# Patient Record
Sex: Male | Born: 1970
Health system: Southern US, Community
[De-identification: ages and names within clinical notes are randomized; demographics above are authoritative.]

## PROBLEM LIST (undated history)

## (undated) DIAGNOSIS — M533 Sacrococcygeal disorders, not elsewhere classified: Secondary | ICD-10-CM

## (undated) DIAGNOSIS — F329 Major depressive disorder, single episode, unspecified: Secondary | ICD-10-CM

## (undated) DIAGNOSIS — F319 Bipolar disorder, unspecified: Secondary | ICD-10-CM

## (undated) DIAGNOSIS — F32A Depression, unspecified: Secondary | ICD-10-CM

## (undated) DIAGNOSIS — R51 Headache: Secondary | ICD-10-CM

## (undated) HISTORY — PX: SKIN CANCER EXCISION: SHX779

## (undated) HISTORY — DX: Bipolar disorder, unspecified: F31.9

## (undated) HISTORY — PX: ANKLE FRACTURE SURGERY: SHX122

## (undated) HISTORY — PX: OTHER SURGICAL HISTORY: SHX169

## (undated) HISTORY — PX: BONY PELVIS SURGERY: SHX572

---

## 2006-08-13 ENCOUNTER — Emergency Department (HOSPITAL_COMMUNITY): Admission: EM | Admit: 2006-08-13 | Discharge: 2006-08-13 | Payer: Self-pay | Admitting: Family Medicine

## 2010-02-04 ENCOUNTER — Emergency Department (HOSPITAL_COMMUNITY): Admission: EM | Admit: 2010-02-04 | Discharge: 2010-02-04 | Payer: Self-pay | Admitting: Emergency Medicine

## 2010-11-26 ENCOUNTER — Encounter: Payer: Self-pay | Admitting: Neurology

## 2012-05-23 ENCOUNTER — Encounter (HOSPITAL_COMMUNITY): Payer: Self-pay | Admitting: *Deleted

## 2012-05-23 ENCOUNTER — Ambulatory Visit (HOSPITAL_COMMUNITY)
Admission: RE | Admit: 2012-05-23 | Discharge: 2012-05-23 | Disposition: A | Discharge status: Home / Self Care | Payer: BC Managed Care – PPO | Attending: Psychiatry | Admitting: Psychiatry

## 2012-05-23 DIAGNOSIS — F319 Bipolar disorder, unspecified: Secondary | ICD-10-CM | POA: Insufficient documentation

## 2012-05-23 HISTORY — DX: Headache: R51
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
## 2013-06-20 ENCOUNTER — Encounter: Payer: Self-pay | Admitting: Emergency Medicine

## 2013-06-20 ENCOUNTER — Emergency Department
Admission: EM | Admit: 2013-06-20 | Discharge: 2013-06-20 | Disposition: A | Discharge status: Home / Self Care | Payer: BC Managed Care – PPO | Source: Home / Self Care | Attending: Family Medicine | Admitting: Family Medicine

## 2013-06-20 DIAGNOSIS — B029 Zoster without complications: Secondary | ICD-10-CM

## 2013-06-20 HISTORY — DX: Sacrococcygeal disorders, not elsewhere classified: M53.3

## 2013-06-20 MED ORDER — VALACYCLOVIR HCL 1 G PO TABS: 1000.0000 mg | ORAL_TABLET | Freq: Three times a day (TID) | ORAL | Status: DC

## 2013-06-20 NOTE — ED Notes (Signed)
Reports onset of rash along rib cage right side, accompanied with pain x 2 days; no known exposure to allergens.

## 2013-06-20 NOTE — ED Provider Notes (Signed)
CSN: [id number]     Arrival date & time 06/20/13  1258 History     First MD Initiated Contact with Patient 06/20/13 1319     Chief Complaint  Patient presents with  . Rash      HPI Comments: Patient noticed a painful vesicular rash on his right lateral chest two days ago, followed by a similar lesion over right scapula.  No fevers, chills, and sweats.  He feels well otherwise.  Patient is a 42 y.o. male presenting with rash. The history is provided by the patient.  Rash Pain location: right chest and right upper back. Pain quality: sharp   Pain radiates to:  Does not radiate Pain severity:  Moderate Onset quality:  Gradual Duration:  2 days Timing:  Constant Progression:  Unchanged Chronicity:  New Relieved by:  Nothing Worsened by:  Nothing tried Ineffective treatments:  None tried Associated symptoms: no anorexia, no chest pain, no chills, no fatigue, no fever and no sore throat     Past Medical History  Diagnosis Date  . Headache    No past surgical history on file. No family history on file. History  Substance Use Topics  . Smoking status: Never Smoker   . Smokeless tobacco: Never Used  . Alcohol Use: No    Review of Systems  Constitutional: Negative for fever, chills and fatigue.  HENT: Negative for sore throat.   Cardiovascular: Negative for chest pain.  Gastrointestinal: Negative for anorexia.  Skin: Positive for rash.    Allergies  Review of patient's allergies indicates no known allergies.  Home Medications   Current Outpatient Rx  Name  Route  Sig  Dispense  Refill  . divalproex (DEPAKOTE) 500 MG DR tablet   Oral   Take 1,000 mg by mouth 2 (two) times daily.         Marland Kitchen lamoTRIgine (LAMICTAL) 150 MG tablet   Oral   Take 150 mg by mouth at bedtime.         . propranolol (INDERAL) 40 MG tablet   Oral   Take 40 mg by mouth 2 (two) times daily.         . SUMAtriptan (IMITREX) 6 MG/0.5ML SOLN injection   Subcutaneous   Inject 6 mg into  the skin every 2 (two) hours as needed. F         . valACYclovir (VALTREX) 1000 MG tablet   Oral   Take 1 tablet (1,000 mg total) by mouth 3 (three) times daily.   21 tablet   0   . zolpidem (AMBIEN) 10 MG tablet   Oral   Take 10 mg by mouth at bedtime as needed.          There were no vitals taken for this visit. Physical Exam  Nursing note and vitals reviewed. Constitutional: He is oriented to person, place, and time. He appears well-developed and well-nourished.  HENT:  Head: Normocephalic.  Mouth/Throat: Oropharynx is clear and moist.  Eyes: Conjunctivae are normal. Pupils are equal, round, and reactive to light.  Neck: Neck supple.  Pulmonary/Chest: Breath sounds normal.  Abdominal: Bowel sounds are normal.  Neurological: He is alert and oriented to person, place, and time.  Skin: Skin is warm and dry. Rash noted. Rash is vesicular.     In the right mid-axillary line is a 1.5cm by 3cm erythematous vesicular eruption.  A smaller lesion is present above the right scapula.    ED Course   Procedures  none  1. Herpes zoster     MDM  Begin Valtrex May use capsaicin topical ointment or cream (such as Zostrix) for pain. Followup with Family Doctor if not improved in one week.   Lattie Haw, MD 06/20/13 1344

## 2016-04-12 DIAGNOSIS — E039 Hypothyroidism, unspecified: Secondary | ICD-10-CM | POA: Insufficient documentation

## 2016-04-12 DIAGNOSIS — E291 Testicular hypofunction: Secondary | ICD-10-CM | POA: Insufficient documentation

## 2016-08-07 DIAGNOSIS — F3131 Bipolar disorder, current episode depressed, mild: Secondary | ICD-10-CM | POA: Diagnosis not present

## 2016-08-07 DIAGNOSIS — F3160 Bipolar disorder, current episode mixed, unspecified: Secondary | ICD-10-CM | POA: Diagnosis not present

## 2016-08-07 DIAGNOSIS — F3111 Bipolar disorder, current episode manic without psychotic features, mild: Secondary | ICD-10-CM | POA: Diagnosis not present

## 2016-08-09 DIAGNOSIS — F3160 Bipolar disorder, current episode mixed, unspecified: Secondary | ICD-10-CM | POA: Diagnosis not present

## 2016-08-14 DIAGNOSIS — F3160 Bipolar disorder, current episode mixed, unspecified: Secondary | ICD-10-CM | POA: Diagnosis not present

## 2016-08-16 DIAGNOSIS — F3160 Bipolar disorder, current episode mixed, unspecified: Secondary | ICD-10-CM | POA: Diagnosis not present

## 2016-08-21 DIAGNOSIS — F3160 Bipolar disorder, current episode mixed, unspecified: Secondary | ICD-10-CM | POA: Diagnosis not present

## 2016-08-23 DIAGNOSIS — F3160 Bipolar disorder, current episode mixed, unspecified: Secondary | ICD-10-CM | POA: Diagnosis not present

## 2016-08-28 DIAGNOSIS — F3160 Bipolar disorder, current episode mixed, unspecified: Secondary | ICD-10-CM | POA: Diagnosis not present

## 2016-08-30 DIAGNOSIS — F3160 Bipolar disorder, current episode mixed, unspecified: Secondary | ICD-10-CM | POA: Diagnosis not present

## 2016-09-03 DIAGNOSIS — F3131 Bipolar disorder, current episode depressed, mild: Secondary | ICD-10-CM | POA: Diagnosis not present

## 2016-09-03 DIAGNOSIS — F3111 Bipolar disorder, current episode manic without psychotic features, mild: Secondary | ICD-10-CM | POA: Diagnosis not present

## 2016-09-04 DIAGNOSIS — F3160 Bipolar disorder, current episode mixed, unspecified: Secondary | ICD-10-CM | POA: Diagnosis not present

## 2016-09-07 DIAGNOSIS — F3160 Bipolar disorder, current episode mixed, unspecified: Secondary | ICD-10-CM | POA: Diagnosis not present

## 2016-09-11 DIAGNOSIS — F3160 Bipolar disorder, current episode mixed, unspecified: Secondary | ICD-10-CM | POA: Diagnosis not present

## 2016-09-13 DIAGNOSIS — F3160 Bipolar disorder, current episode mixed, unspecified: Secondary | ICD-10-CM | POA: Diagnosis not present

## 2016-09-18 DIAGNOSIS — F3160 Bipolar disorder, current episode mixed, unspecified: Secondary | ICD-10-CM | POA: Diagnosis not present

## 2016-09-20 DIAGNOSIS — F3160 Bipolar disorder, current episode mixed, unspecified: Secondary | ICD-10-CM | POA: Diagnosis not present

## 2016-09-25 DIAGNOSIS — F3160 Bipolar disorder, current episode mixed, unspecified: Secondary | ICD-10-CM | POA: Diagnosis not present

## 2016-10-02 DIAGNOSIS — F3160 Bipolar disorder, current episode mixed, unspecified: Secondary | ICD-10-CM | POA: Diagnosis not present

## 2016-10-08 DIAGNOSIS — G43009 Migraine without aura, not intractable, without status migrainosus: Secondary | ICD-10-CM | POA: Diagnosis not present

## 2016-10-08 DIAGNOSIS — F3160 Bipolar disorder, current episode mixed, unspecified: Secondary | ICD-10-CM | POA: Diagnosis not present

## 2016-10-11 DIAGNOSIS — F3160 Bipolar disorder, current episode mixed, unspecified: Secondary | ICD-10-CM | POA: Diagnosis not present

## 2016-10-12 DIAGNOSIS — F3160 Bipolar disorder, current episode mixed, unspecified: Secondary | ICD-10-CM | POA: Diagnosis not present

## 2016-10-12 DIAGNOSIS — E039 Hypothyroidism, unspecified: Secondary | ICD-10-CM | POA: Diagnosis not present

## 2016-10-12 DIAGNOSIS — E291 Testicular hypofunction: Secondary | ICD-10-CM | POA: Diagnosis not present

## 2016-10-16 DIAGNOSIS — E039 Hypothyroidism, unspecified: Secondary | ICD-10-CM | POA: Diagnosis not present

## 2016-10-16 DIAGNOSIS — F900 Attention-deficit hyperactivity disorder, predominantly inattentive type: Secondary | ICD-10-CM | POA: Diagnosis not present

## 2016-10-16 DIAGNOSIS — F341 Dysthymic disorder: Secondary | ICD-10-CM | POA: Diagnosis not present

## 2016-10-16 DIAGNOSIS — E291 Testicular hypofunction: Secondary | ICD-10-CM | POA: Diagnosis not present

## 2016-10-22 DIAGNOSIS — F3160 Bipolar disorder, current episode mixed, unspecified: Secondary | ICD-10-CM | POA: Diagnosis not present

## 2016-11-01 DIAGNOSIS — F3160 Bipolar disorder, current episode mixed, unspecified: Secondary | ICD-10-CM | POA: Diagnosis not present

## 2016-11-06 DIAGNOSIS — F3160 Bipolar disorder, current episode mixed, unspecified: Secondary | ICD-10-CM | POA: Diagnosis not present

## 2016-11-08 DIAGNOSIS — F3160 Bipolar disorder, current episode mixed, unspecified: Secondary | ICD-10-CM | POA: Diagnosis not present

## 2016-11-12 DIAGNOSIS — F3160 Bipolar disorder, current episode mixed, unspecified: Secondary | ICD-10-CM | POA: Diagnosis not present

## 2016-11-15 DIAGNOSIS — F3160 Bipolar disorder, current episode mixed, unspecified: Secondary | ICD-10-CM | POA: Diagnosis not present

## 2016-11-22 DIAGNOSIS — F3160 Bipolar disorder, current episode mixed, unspecified: Secondary | ICD-10-CM | POA: Diagnosis not present

## 2016-11-27 DIAGNOSIS — F3160 Bipolar disorder, current episode mixed, unspecified: Secondary | ICD-10-CM | POA: Diagnosis not present

## 2016-11-29 DIAGNOSIS — F3160 Bipolar disorder, current episode mixed, unspecified: Secondary | ICD-10-CM | POA: Diagnosis not present

## 2016-12-03 DIAGNOSIS — F3160 Bipolar disorder, current episode mixed, unspecified: Secondary | ICD-10-CM | POA: Diagnosis not present

## 2016-12-06 DIAGNOSIS — F3160 Bipolar disorder, current episode mixed, unspecified: Secondary | ICD-10-CM | POA: Diagnosis not present

## 2016-12-10 DIAGNOSIS — F3160 Bipolar disorder, current episode mixed, unspecified: Secondary | ICD-10-CM | POA: Diagnosis not present

## 2016-12-13 DIAGNOSIS — F3160 Bipolar disorder, current episode mixed, unspecified: Secondary | ICD-10-CM | POA: Diagnosis not present

## 2016-12-17 DIAGNOSIS — F3160 Bipolar disorder, current episode mixed, unspecified: Secondary | ICD-10-CM | POA: Diagnosis not present

## 2016-12-20 DIAGNOSIS — F3160 Bipolar disorder, current episode mixed, unspecified: Secondary | ICD-10-CM | POA: Diagnosis not present

## 2016-12-24 DIAGNOSIS — F3160 Bipolar disorder, current episode mixed, unspecified: Secondary | ICD-10-CM | POA: Diagnosis not present

## 2016-12-27 DIAGNOSIS — F3160 Bipolar disorder, current episode mixed, unspecified: Secondary | ICD-10-CM | POA: Diagnosis not present

## 2017-01-01 DIAGNOSIS — F3160 Bipolar disorder, current episode mixed, unspecified: Secondary | ICD-10-CM | POA: Diagnosis not present

## 2017-01-03 DIAGNOSIS — F3160 Bipolar disorder, current episode mixed, unspecified: Secondary | ICD-10-CM | POA: Diagnosis not present

## 2017-01-07 DIAGNOSIS — D225 Melanocytic nevi of trunk: Secondary | ICD-10-CM | POA: Diagnosis not present

## 2017-01-07 DIAGNOSIS — D485 Neoplasm of uncertain behavior of skin: Secondary | ICD-10-CM | POA: Diagnosis not present

## 2017-01-07 DIAGNOSIS — Z809 Family history of malignant neoplasm, unspecified: Secondary | ICD-10-CM | POA: Diagnosis not present

## 2017-01-07 DIAGNOSIS — Z1283 Encounter for screening for malignant neoplasm of skin: Secondary | ICD-10-CM | POA: Diagnosis not present

## 2017-01-07 DIAGNOSIS — L821 Other seborrheic keratosis: Secondary | ICD-10-CM | POA: Diagnosis not present

## 2017-01-07 DIAGNOSIS — F3160 Bipolar disorder, current episode mixed, unspecified: Secondary | ICD-10-CM | POA: Diagnosis not present

## 2017-01-10 DIAGNOSIS — F3160 Bipolar disorder, current episode mixed, unspecified: Secondary | ICD-10-CM | POA: Diagnosis not present

## 2017-01-11 DIAGNOSIS — F900 Attention-deficit hyperactivity disorder, predominantly inattentive type: Secondary | ICD-10-CM | POA: Diagnosis not present

## 2017-01-11 DIAGNOSIS — F3132 Bipolar disorder, current episode depressed, moderate: Secondary | ICD-10-CM | POA: Diagnosis not present

## 2017-01-16 DIAGNOSIS — G43009 Migraine without aura, not intractable, without status migrainosus: Secondary | ICD-10-CM | POA: Diagnosis not present

## 2017-01-17 DIAGNOSIS — F3160 Bipolar disorder, current episode mixed, unspecified: Secondary | ICD-10-CM | POA: Diagnosis not present

## 2017-01-24 DIAGNOSIS — F3160 Bipolar disorder, current episode mixed, unspecified: Secondary | ICD-10-CM | POA: Diagnosis not present

## 2017-01-28 DIAGNOSIS — F3160 Bipolar disorder, current episode mixed, unspecified: Secondary | ICD-10-CM | POA: Diagnosis not present

## 2017-01-31 DIAGNOSIS — F3160 Bipolar disorder, current episode mixed, unspecified: Secondary | ICD-10-CM | POA: Diagnosis not present

## 2017-02-07 ENCOUNTER — Encounter: Payer: Self-pay | Admitting: Psychiatry

## 2017-02-07 ENCOUNTER — Ambulatory Visit (INDEPENDENT_AMBULATORY_CARE_PROVIDER_SITE_OTHER): Payer: BLUE CROSS/BLUE SHIELD | Admitting: Psychiatry

## 2017-02-07 VITALS — BP 112/77 | HR 84 | Temp 97.5°F | Wt 176.2 lb

## 2017-02-07 DIAGNOSIS — F332 Major depressive disorder, recurrent severe without psychotic features: Secondary | ICD-10-CM | POA: Diagnosis not present

## 2017-02-07 NOTE — Progress Notes (Signed)
ECT: This is an ECT consult for this 46 year old man referred by his outpatient psychiatrist, Dr.Kaur. Patient presents along with his wife. Both of them contributed to the history but there was agreement between them.  Current complaints of the patient are persistently depressed mood which is present almost all of the time. He still has some things in his life that are positive and he has been able to function adequately at work but feels sad and anxious much of the time. He sleeps adequately although he works night shift. His appetite is been a little poor. He has had some intrusive suicidal thoughts but states he has not acted on them and has no intention or plan to act on them. Patient feels hopeless and negative much of the time. He is currently being treated by his outpatient psychiatrist and is on Depakote Latuda and Adderall. He has no psychotic symptoms. No homicidal ideation.  Patient states his depression has been present since he was a young man. Things escalated and got much worse when his mother and sister died many years ago. Since then he has had brief periods of recovery but for the last year has been much worse. Suicidal ideation has gotten more intrusive. He has had 1 previous psychiatric hospitalization but no history of suicide attempts. Medications that have been tried that he lists include the current Depakote Latuda and Adderall as well as lithium and Seroquel. His outpatient psychiatrist sent me a note saying that he had been on antidepressants as well although he could not remember them. Apparently a diagnosis of bipolar disorder had been deposited based on his fact of having some lability and anger problems although I did not elicit a history of frank mania.  Patient is married. Has 2 teenage children at home. Patient's relationship with his wife is somewhat strained because he cheated on his wife in the last couple years and although he says that he no longer is doing that it is  still a major issue for him and her. He works as a Production manager. Works night shift. Says that he just barely gets by doing an adequate job at his work.  Patient has migraine headaches. That is the rationale for the Depakote. Also takes Imitrex. He tells me that he had a workup for his migraines in the past including an MRI and when he describes that he says that he was told that there was some kind of a mass in the lower part of his brain. He doesn't know much more about it. No other active medical issues.  Patient says he drinks occasionally and on weekends denies regular drinking denies it ever being a pattern or a problem. Denies any other drug abuse.  Patient is a neatly dressed man who looks his stated age. Cooperative with the interview. Eye contact is diminished. He looks down and looks a embarrassed much of the time. He is a little bit fidgety and withdrawn. Affect is blunted blank and negative. Thoughts appear lucid with no sign of bizarre or delusional thinking. Denies hallucinations. He is alert and oriented 4. A little bit psychomotor slowed and his speech is a little slow. Suicidal thoughts without intent or plan no homicidal thoughts. Able to understand medical decision making and make appropriate plans and decisions.  Assessment: This is a 46 year old man with severe recurrent major depression versus possible bipolar or bipolar 2. Depression is severe with intrusive suicidal thoughts and impairment in his life functioning. He has been on medication for years  without significant improvement. Based on this he would be a reasonable candidate for ECT.  I first concern is about his report of the MRI. I'm very concerned about the possibility of some kind offeeling lesion or abnormality on his brain MRI. For this reason I am reordering a head CT scan with his labs and we'll also get a release of information to get the old MRI scan. I will also concerned about his Depakote but because  it is helping with his headaches would not necessarily ask him to discontinue it until we see whether he can have affective seizures.  Patient given ample opportunity to ask questions. He states a tentative agreement with plan for ECT. Procedure and plan were described in detail. Patient was given the order form to get the usual labs done as well as getting a head CT. We will try and check up on the MRI as well. Patient has our number and we will be in touch. I will pass on information to the ECT team.

## 2017-02-11 DIAGNOSIS — F3160 Bipolar disorder, current episode mixed, unspecified: Secondary | ICD-10-CM | POA: Diagnosis not present

## 2017-02-14 DIAGNOSIS — F3160 Bipolar disorder, current episode mixed, unspecified: Secondary | ICD-10-CM | POA: Diagnosis not present

## 2017-02-18 DIAGNOSIS — F3160 Bipolar disorder, current episode mixed, unspecified: Secondary | ICD-10-CM | POA: Diagnosis not present

## 2017-02-25 DIAGNOSIS — S76301A Unspecified injury of muscle, fascia and tendon of the posterior muscle group at thigh level, right thigh, initial encounter: Secondary | ICD-10-CM | POA: Diagnosis not present

## 2017-02-26 DIAGNOSIS — F3160 Bipolar disorder, current episode mixed, unspecified: Secondary | ICD-10-CM | POA: Diagnosis not present

## 2017-02-28 DIAGNOSIS — F3160 Bipolar disorder, current episode mixed, unspecified: Secondary | ICD-10-CM | POA: Diagnosis not present

## 2017-03-04 DIAGNOSIS — F3160 Bipolar disorder, current episode mixed, unspecified: Secondary | ICD-10-CM | POA: Diagnosis not present

## 2017-03-07 DIAGNOSIS — F3160 Bipolar disorder, current episode mixed, unspecified: Secondary | ICD-10-CM | POA: Diagnosis not present

## 2017-03-11 DIAGNOSIS — F3160 Bipolar disorder, current episode mixed, unspecified: Secondary | ICD-10-CM | POA: Diagnosis not present

## 2017-03-14 DIAGNOSIS — F3160 Bipolar disorder, current episode mixed, unspecified: Secondary | ICD-10-CM | POA: Diagnosis not present

## 2017-03-15 DIAGNOSIS — F332 Major depressive disorder, recurrent severe without psychotic features: Secondary | ICD-10-CM | POA: Diagnosis not present

## 2017-03-15 DIAGNOSIS — F900 Attention-deficit hyperactivity disorder, predominantly inattentive type: Secondary | ICD-10-CM | POA: Diagnosis not present

## 2017-03-18 DIAGNOSIS — F3160 Bipolar disorder, current episode mixed, unspecified: Secondary | ICD-10-CM | POA: Diagnosis not present

## 2017-03-19 DIAGNOSIS — F3160 Bipolar disorder, current episode mixed, unspecified: Secondary | ICD-10-CM | POA: Diagnosis not present

## 2017-03-21 DIAGNOSIS — F3160 Bipolar disorder, current episode mixed, unspecified: Secondary | ICD-10-CM | POA: Diagnosis not present

## 2017-03-25 DIAGNOSIS — F3160 Bipolar disorder, current episode mixed, unspecified: Secondary | ICD-10-CM | POA: Diagnosis not present

## 2017-03-26 DIAGNOSIS — F3160 Bipolar disorder, current episode mixed, unspecified: Secondary | ICD-10-CM | POA: Diagnosis not present

## 2017-03-28 DIAGNOSIS — F3160 Bipolar disorder, current episode mixed, unspecified: Secondary | ICD-10-CM | POA: Diagnosis not present

## 2017-04-04 DIAGNOSIS — F3160 Bipolar disorder, current episode mixed, unspecified: Secondary | ICD-10-CM | POA: Diagnosis not present

## 2017-04-08 DIAGNOSIS — F3160 Bipolar disorder, current episode mixed, unspecified: Secondary | ICD-10-CM | POA: Diagnosis not present

## 2017-04-09 DIAGNOSIS — F3160 Bipolar disorder, current episode mixed, unspecified: Secondary | ICD-10-CM | POA: Diagnosis not present

## 2017-04-11 DIAGNOSIS — F3160 Bipolar disorder, current episode mixed, unspecified: Secondary | ICD-10-CM | POA: Diagnosis not present

## 2017-04-15 DIAGNOSIS — E291 Testicular hypofunction: Secondary | ICD-10-CM | POA: Diagnosis not present

## 2017-04-15 DIAGNOSIS — G43019 Migraine without aura, intractable, without status migrainosus: Secondary | ICD-10-CM | POA: Diagnosis not present

## 2017-04-15 DIAGNOSIS — E039 Hypothyroidism, unspecified: Secondary | ICD-10-CM | POA: Diagnosis not present

## 2017-04-15 DIAGNOSIS — Z79899 Other long term (current) drug therapy: Secondary | ICD-10-CM | POA: Diagnosis not present

## 2017-04-15 DIAGNOSIS — N528 Other male erectile dysfunction: Secondary | ICD-10-CM | POA: Diagnosis not present

## 2017-04-15 DIAGNOSIS — F3160 Bipolar disorder, current episode mixed, unspecified: Secondary | ICD-10-CM | POA: Diagnosis not present

## 2017-04-17 DIAGNOSIS — E291 Testicular hypofunction: Secondary | ICD-10-CM | POA: Diagnosis not present

## 2017-04-17 DIAGNOSIS — E039 Hypothyroidism, unspecified: Secondary | ICD-10-CM | POA: Diagnosis not present

## 2017-04-18 DIAGNOSIS — G43009 Migraine without aura, not intractable, without status migrainosus: Secondary | ICD-10-CM | POA: Diagnosis not present

## 2017-04-18 DIAGNOSIS — F3160 Bipolar disorder, current episode mixed, unspecified: Secondary | ICD-10-CM | POA: Diagnosis not present

## 2017-04-22 DIAGNOSIS — F3160 Bipolar disorder, current episode mixed, unspecified: Secondary | ICD-10-CM | POA: Diagnosis not present

## 2017-04-23 DIAGNOSIS — F900 Attention-deficit hyperactivity disorder, predominantly inattentive type: Secondary | ICD-10-CM | POA: Diagnosis not present

## 2017-04-23 DIAGNOSIS — F331 Major depressive disorder, recurrent, moderate: Secondary | ICD-10-CM | POA: Diagnosis not present

## 2017-04-25 DIAGNOSIS — F3160 Bipolar disorder, current episode mixed, unspecified: Secondary | ICD-10-CM | POA: Diagnosis not present

## 2017-04-30 DIAGNOSIS — F3160 Bipolar disorder, current episode mixed, unspecified: Secondary | ICD-10-CM | POA: Diagnosis not present

## 2017-05-02 DIAGNOSIS — F3160 Bipolar disorder, current episode mixed, unspecified: Secondary | ICD-10-CM | POA: Diagnosis not present

## 2017-05-06 DIAGNOSIS — F3160 Bipolar disorder, current episode mixed, unspecified: Secondary | ICD-10-CM | POA: Diagnosis not present

## 2017-05-07 DIAGNOSIS — F3160 Bipolar disorder, current episode mixed, unspecified: Secondary | ICD-10-CM | POA: Diagnosis not present

## 2017-05-09 DIAGNOSIS — F3160 Bipolar disorder, current episode mixed, unspecified: Secondary | ICD-10-CM | POA: Diagnosis not present

## 2017-05-13 DIAGNOSIS — F3160 Bipolar disorder, current episode mixed, unspecified: Secondary | ICD-10-CM | POA: Diagnosis not present

## 2017-05-16 DIAGNOSIS — F3160 Bipolar disorder, current episode mixed, unspecified: Secondary | ICD-10-CM | POA: Diagnosis not present

## 2017-05-20 DIAGNOSIS — F3160 Bipolar disorder, current episode mixed, unspecified: Secondary | ICD-10-CM | POA: Diagnosis not present

## 2017-05-23 DIAGNOSIS — F3160 Bipolar disorder, current episode mixed, unspecified: Secondary | ICD-10-CM | POA: Diagnosis not present

## 2017-05-30 DIAGNOSIS — F3160 Bipolar disorder, current episode mixed, unspecified: Secondary | ICD-10-CM | POA: Diagnosis not present

## 2017-06-03 DIAGNOSIS — F3160 Bipolar disorder, current episode mixed, unspecified: Secondary | ICD-10-CM | POA: Diagnosis not present

## 2017-06-04 DIAGNOSIS — F3160 Bipolar disorder, current episode mixed, unspecified: Secondary | ICD-10-CM | POA: Diagnosis not present

## 2017-06-06 DIAGNOSIS — F3160 Bipolar disorder, current episode mixed, unspecified: Secondary | ICD-10-CM | POA: Diagnosis not present

## 2017-06-10 DIAGNOSIS — F3160 Bipolar disorder, current episode mixed, unspecified: Secondary | ICD-10-CM | POA: Diagnosis not present

## 2017-06-13 DIAGNOSIS — F3160 Bipolar disorder, current episode mixed, unspecified: Secondary | ICD-10-CM | POA: Diagnosis not present

## 2017-06-17 DIAGNOSIS — F3160 Bipolar disorder, current episode mixed, unspecified: Secondary | ICD-10-CM | POA: Diagnosis not present

## 2017-06-20 DIAGNOSIS — F3160 Bipolar disorder, current episode mixed, unspecified: Secondary | ICD-10-CM | POA: Diagnosis not present

## 2017-06-27 DIAGNOSIS — F3160 Bipolar disorder, current episode mixed, unspecified: Secondary | ICD-10-CM | POA: Diagnosis not present

## 2017-07-04 DIAGNOSIS — F3160 Bipolar disorder, current episode mixed, unspecified: Secondary | ICD-10-CM | POA: Diagnosis not present

## 2017-07-11 DIAGNOSIS — F3160 Bipolar disorder, current episode mixed, unspecified: Secondary | ICD-10-CM | POA: Diagnosis not present

## 2017-07-18 DIAGNOSIS — F3160 Bipolar disorder, current episode mixed, unspecified: Secondary | ICD-10-CM | POA: Diagnosis not present

## 2017-07-23 DIAGNOSIS — F3342 Major depressive disorder, recurrent, in full remission: Secondary | ICD-10-CM | POA: Diagnosis not present

## 2017-07-25 DIAGNOSIS — F3160 Bipolar disorder, current episode mixed, unspecified: Secondary | ICD-10-CM | POA: Diagnosis not present

## 2017-07-29 DIAGNOSIS — F3160 Bipolar disorder, current episode mixed, unspecified: Secondary | ICD-10-CM | POA: Diagnosis not present

## 2017-08-01 DIAGNOSIS — F3160 Bipolar disorder, current episode mixed, unspecified: Secondary | ICD-10-CM | POA: Diagnosis not present

## 2017-08-05 DIAGNOSIS — F3160 Bipolar disorder, current episode mixed, unspecified: Secondary | ICD-10-CM | POA: Diagnosis not present

## 2017-08-08 DIAGNOSIS — F3160 Bipolar disorder, current episode mixed, unspecified: Secondary | ICD-10-CM | POA: Diagnosis not present

## 2017-08-12 DIAGNOSIS — F3160 Bipolar disorder, current episode mixed, unspecified: Secondary | ICD-10-CM | POA: Diagnosis not present

## 2017-08-15 DIAGNOSIS — F3160 Bipolar disorder, current episode mixed, unspecified: Secondary | ICD-10-CM | POA: Diagnosis not present

## 2017-08-23 DIAGNOSIS — F3160 Bipolar disorder, current episode mixed, unspecified: Secondary | ICD-10-CM | POA: Diagnosis not present

## 2017-09-02 DIAGNOSIS — F3160 Bipolar disorder, current episode mixed, unspecified: Secondary | ICD-10-CM | POA: Diagnosis not present

## 2017-09-03 DIAGNOSIS — F3160 Bipolar disorder, current episode mixed, unspecified: Secondary | ICD-10-CM | POA: Diagnosis not present

## 2017-09-04 DIAGNOSIS — E039 Hypothyroidism, unspecified: Secondary | ICD-10-CM | POA: Diagnosis not present

## 2017-09-04 DIAGNOSIS — E291 Testicular hypofunction: Secondary | ICD-10-CM | POA: Diagnosis not present

## 2017-09-04 DIAGNOSIS — N528 Other male erectile dysfunction: Secondary | ICD-10-CM | POA: Diagnosis not present

## 2017-09-05 DIAGNOSIS — F3160 Bipolar disorder, current episode mixed, unspecified: Secondary | ICD-10-CM | POA: Diagnosis not present

## 2017-09-09 DIAGNOSIS — F3160 Bipolar disorder, current episode mixed, unspecified: Secondary | ICD-10-CM | POA: Diagnosis not present

## 2017-09-12 DIAGNOSIS — E291 Testicular hypofunction: Secondary | ICD-10-CM | POA: Diagnosis not present

## 2017-09-13 DIAGNOSIS — N528 Other male erectile dysfunction: Secondary | ICD-10-CM | POA: Diagnosis not present

## 2017-09-13 DIAGNOSIS — E291 Testicular hypofunction: Secondary | ICD-10-CM | POA: Diagnosis not present

## 2017-09-13 DIAGNOSIS — N5203 Combined arterial insufficiency and corporo-venous occlusive erectile dysfunction: Secondary | ICD-10-CM | POA: Diagnosis not present

## 2017-09-16 DIAGNOSIS — F3160 Bipolar disorder, current episode mixed, unspecified: Secondary | ICD-10-CM | POA: Diagnosis not present

## 2017-09-19 DIAGNOSIS — F3160 Bipolar disorder, current episode mixed, unspecified: Secondary | ICD-10-CM | POA: Diagnosis not present

## 2017-09-23 DIAGNOSIS — F3160 Bipolar disorder, current episode mixed, unspecified: Secondary | ICD-10-CM | POA: Diagnosis not present

## 2017-09-30 DIAGNOSIS — F3160 Bipolar disorder, current episode mixed, unspecified: Secondary | ICD-10-CM | POA: Diagnosis not present

## 2017-10-01 DIAGNOSIS — F341 Dysthymic disorder: Secondary | ICD-10-CM | POA: Diagnosis not present

## 2017-10-03 DIAGNOSIS — F3160 Bipolar disorder, current episode mixed, unspecified: Secondary | ICD-10-CM | POA: Diagnosis not present

## 2017-10-07 DIAGNOSIS — F3160 Bipolar disorder, current episode mixed, unspecified: Secondary | ICD-10-CM | POA: Diagnosis not present

## 2017-10-10 DIAGNOSIS — F3160 Bipolar disorder, current episode mixed, unspecified: Secondary | ICD-10-CM | POA: Diagnosis not present

## 2017-10-14 DIAGNOSIS — F3160 Bipolar disorder, current episode mixed, unspecified: Secondary | ICD-10-CM | POA: Diagnosis not present

## 2017-10-21 DIAGNOSIS — F3160 Bipolar disorder, current episode mixed, unspecified: Secondary | ICD-10-CM | POA: Diagnosis not present

## 2017-10-23 DIAGNOSIS — F3160 Bipolar disorder, current episode mixed, unspecified: Secondary | ICD-10-CM | POA: Diagnosis not present

## 2017-10-24 DIAGNOSIS — F3160 Bipolar disorder, current episode mixed, unspecified: Secondary | ICD-10-CM | POA: Diagnosis not present

## 2017-11-06 DIAGNOSIS — E291 Testicular hypofunction: Secondary | ICD-10-CM | POA: Diagnosis not present

## 2017-11-07 DIAGNOSIS — F3160 Bipolar disorder, current episode mixed, unspecified: Secondary | ICD-10-CM | POA: Diagnosis not present

## 2017-12-24 ENCOUNTER — Encounter (HOSPITAL_COMMUNITY): Payer: Self-pay

## 2017-12-24 ENCOUNTER — Ambulatory Visit (HOSPITAL_COMMUNITY)
Admission: RE | Admit: 2017-12-24 | Discharge: 2017-12-24 | Disposition: A | Discharge status: Home / Self Care | Payer: BLUE CROSS/BLUE SHIELD | Source: Ambulatory Visit | Attending: Family Medicine | Admitting: Family Medicine

## 2017-12-24 ENCOUNTER — Other Ambulatory Visit (HOSPITAL_COMMUNITY): Payer: Self-pay | Admitting: Family Medicine

## 2017-12-24 DIAGNOSIS — M79652 Pain in left thigh: Secondary | ICD-10-CM | POA: Diagnosis not present

## 2017-12-24 DIAGNOSIS — M79605 Pain in left leg: Secondary | ICD-10-CM

## 2017-12-24 DIAGNOSIS — M7989 Other specified soft tissue disorders: Principal | ICD-10-CM

## 2018-01-09 DIAGNOSIS — F4010 Social phobia, unspecified: Secondary | ICD-10-CM | POA: Diagnosis not present

## 2018-01-09 DIAGNOSIS — F3342 Major depressive disorder, recurrent, in full remission: Secondary | ICD-10-CM | POA: Diagnosis not present

## 2018-01-09 DIAGNOSIS — F900 Attention-deficit hyperactivity disorder, predominantly inattentive type: Secondary | ICD-10-CM | POA: Diagnosis not present

## 2018-02-11 DIAGNOSIS — E291 Testicular hypofunction: Secondary | ICD-10-CM | POA: Diagnosis not present

## 2018-03-04 DIAGNOSIS — E039 Hypothyroidism, unspecified: Secondary | ICD-10-CM | POA: Diagnosis not present

## 2018-03-04 DIAGNOSIS — D751 Secondary polycythemia: Secondary | ICD-10-CM | POA: Diagnosis not present

## 2018-03-04 DIAGNOSIS — E291 Testicular hypofunction: Secondary | ICD-10-CM | POA: Diagnosis not present

## 2018-04-02 DIAGNOSIS — R0789 Other chest pain: Secondary | ICD-10-CM | POA: Diagnosis not present

## 2018-04-02 DIAGNOSIS — I471 Supraventricular tachycardia: Secondary | ICD-10-CM | POA: Diagnosis not present

## 2018-04-02 DIAGNOSIS — D751 Secondary polycythemia: Secondary | ICD-10-CM | POA: Diagnosis not present

## 2018-04-02 DIAGNOSIS — E039 Hypothyroidism, unspecified: Secondary | ICD-10-CM | POA: Diagnosis not present

## 2018-04-02 DIAGNOSIS — R079 Chest pain, unspecified: Secondary | ICD-10-CM | POA: Diagnosis not present

## 2018-04-02 DIAGNOSIS — K219 Gastro-esophageal reflux disease without esophagitis: Secondary | ICD-10-CM | POA: Diagnosis not present

## 2018-04-02 DIAGNOSIS — F329 Major depressive disorder, single episode, unspecified: Secondary | ICD-10-CM | POA: Diagnosis not present

## 2018-04-02 DIAGNOSIS — Z79899 Other long term (current) drug therapy: Secondary | ICD-10-CM | POA: Diagnosis not present

## 2018-04-02 DIAGNOSIS — E291 Testicular hypofunction: Secondary | ICD-10-CM | POA: Diagnosis not present

## 2018-04-03 DIAGNOSIS — G43809 Other migraine, not intractable, without status migrainosus: Secondary | ICD-10-CM | POA: Diagnosis not present

## 2018-04-03 DIAGNOSIS — F339 Major depressive disorder, recurrent, unspecified: Secondary | ICD-10-CM | POA: Diagnosis not present

## 2018-04-03 DIAGNOSIS — R079 Chest pain, unspecified: Secondary | ICD-10-CM | POA: Diagnosis not present

## 2018-04-03 DIAGNOSIS — E039 Hypothyroidism, unspecified: Secondary | ICD-10-CM | POA: Diagnosis not present

## 2018-04-03 DIAGNOSIS — R0789 Other chest pain: Secondary | ICD-10-CM | POA: Diagnosis not present

## 2018-04-23 DIAGNOSIS — G43109 Migraine with aura, not intractable, without status migrainosus: Secondary | ICD-10-CM | POA: Diagnosis not present

## 2018-05-16 DIAGNOSIS — R0789 Other chest pain: Secondary | ICD-10-CM | POA: Diagnosis not present

## 2018-05-20 DIAGNOSIS — F332 Major depressive disorder, recurrent severe without psychotic features: Secondary | ICD-10-CM | POA: Diagnosis not present

## 2018-05-20 DIAGNOSIS — F900 Attention-deficit hyperactivity disorder, predominantly inattentive type: Secondary | ICD-10-CM | POA: Diagnosis not present

## 2018-06-02 DIAGNOSIS — F332 Major depressive disorder, recurrent severe without psychotic features: Secondary | ICD-10-CM | POA: Diagnosis not present

## 2018-06-04 DIAGNOSIS — F332 Major depressive disorder, recurrent severe without psychotic features: Secondary | ICD-10-CM | POA: Diagnosis not present

## 2018-06-09 DIAGNOSIS — F332 Major depressive disorder, recurrent severe without psychotic features: Secondary | ICD-10-CM | POA: Diagnosis not present

## 2018-06-11 DIAGNOSIS — F332 Major depressive disorder, recurrent severe without psychotic features: Secondary | ICD-10-CM | POA: Diagnosis not present

## 2018-06-16 DIAGNOSIS — F332 Major depressive disorder, recurrent severe without psychotic features: Secondary | ICD-10-CM | POA: Diagnosis not present

## 2018-06-27 DIAGNOSIS — F332 Major depressive disorder, recurrent severe without psychotic features: Secondary | ICD-10-CM | POA: Diagnosis not present

## 2018-06-30 DIAGNOSIS — F332 Major depressive disorder, recurrent severe without psychotic features: Secondary | ICD-10-CM | POA: Diagnosis not present

## 2018-07-03 DIAGNOSIS — F332 Major depressive disorder, recurrent severe without psychotic features: Secondary | ICD-10-CM | POA: Diagnosis not present

## 2018-07-08 DIAGNOSIS — F332 Major depressive disorder, recurrent severe without psychotic features: Secondary | ICD-10-CM | POA: Diagnosis not present

## 2018-07-15 DIAGNOSIS — F332 Major depressive disorder, recurrent severe without psychotic features: Secondary | ICD-10-CM | POA: Diagnosis not present

## 2018-07-18 DIAGNOSIS — F332 Major depressive disorder, recurrent severe without psychotic features: Secondary | ICD-10-CM | POA: Diagnosis not present

## 2018-07-24 DIAGNOSIS — F332 Major depressive disorder, recurrent severe without psychotic features: Secondary | ICD-10-CM | POA: Diagnosis not present

## 2018-07-29 DIAGNOSIS — F332 Major depressive disorder, recurrent severe without psychotic features: Secondary | ICD-10-CM | POA: Diagnosis not present

## 2018-08-01 DIAGNOSIS — F332 Major depressive disorder, recurrent severe without psychotic features: Secondary | ICD-10-CM | POA: Diagnosis not present

## 2018-08-05 DIAGNOSIS — F332 Major depressive disorder, recurrent severe without psychotic features: Secondary | ICD-10-CM | POA: Diagnosis not present

## 2018-08-08 DIAGNOSIS — F332 Major depressive disorder, recurrent severe without psychotic features: Secondary | ICD-10-CM | POA: Diagnosis not present

## 2018-08-11 DIAGNOSIS — F332 Major depressive disorder, recurrent severe without psychotic features: Secondary | ICD-10-CM | POA: Diagnosis not present

## 2018-08-12 DIAGNOSIS — M25871 Other specified joint disorders, right ankle and foot: Secondary | ICD-10-CM | POA: Diagnosis not present

## 2018-08-12 DIAGNOSIS — M25571 Pain in right ankle and joints of right foot: Secondary | ICD-10-CM | POA: Diagnosis not present

## 2018-08-12 DIAGNOSIS — S82891S Other fracture of right lower leg, sequela: Secondary | ICD-10-CM | POA: Diagnosis not present

## 2018-08-12 DIAGNOSIS — T8484XA Pain due to internal orthopedic prosthetic devices, implants and grafts, initial encounter: Secondary | ICD-10-CM | POA: Diagnosis not present

## 2018-08-13 DIAGNOSIS — D751 Secondary polycythemia: Secondary | ICD-10-CM | POA: Diagnosis not present

## 2018-08-13 DIAGNOSIS — E291 Testicular hypofunction: Secondary | ICD-10-CM | POA: Diagnosis not present

## 2018-08-13 DIAGNOSIS — E039 Hypothyroidism, unspecified: Secondary | ICD-10-CM | POA: Diagnosis not present

## 2018-08-15 DIAGNOSIS — F332 Major depressive disorder, recurrent severe without psychotic features: Secondary | ICD-10-CM | POA: Diagnosis not present

## 2018-08-19 DIAGNOSIS — F332 Major depressive disorder, recurrent severe without psychotic features: Secondary | ICD-10-CM | POA: Diagnosis not present

## 2018-08-22 DIAGNOSIS — F332 Major depressive disorder, recurrent severe without psychotic features: Secondary | ICD-10-CM | POA: Diagnosis not present

## 2018-08-26 DIAGNOSIS — F4322 Adjustment disorder with anxiety: Secondary | ICD-10-CM | POA: Diagnosis not present

## 2018-08-26 DIAGNOSIS — F332 Major depressive disorder, recurrent severe without psychotic features: Secondary | ICD-10-CM | POA: Diagnosis not present

## 2018-08-26 DIAGNOSIS — F341 Dysthymic disorder: Secondary | ICD-10-CM | POA: Diagnosis not present

## 2018-08-27 DIAGNOSIS — F341 Dysthymic disorder: Secondary | ICD-10-CM | POA: Diagnosis not present

## 2018-08-27 DIAGNOSIS — F4322 Adjustment disorder with anxiety: Secondary | ICD-10-CM | POA: Diagnosis not present

## 2018-08-27 DIAGNOSIS — H5017 Alternating exotropia with V pattern: Secondary | ICD-10-CM | POA: Diagnosis not present

## 2018-08-27 DIAGNOSIS — F332 Major depressive disorder, recurrent severe without psychotic features: Secondary | ICD-10-CM | POA: Diagnosis not present

## 2018-08-28 DIAGNOSIS — F332 Major depressive disorder, recurrent severe without psychotic features: Secondary | ICD-10-CM | POA: Diagnosis not present

## 2018-09-02 DIAGNOSIS — F332 Major depressive disorder, recurrent severe without psychotic features: Secondary | ICD-10-CM | POA: Diagnosis not present

## 2018-09-03 DIAGNOSIS — K760 Fatty (change of) liver, not elsewhere classified: Secondary | ICD-10-CM | POA: Diagnosis not present

## 2018-09-03 DIAGNOSIS — R1084 Generalized abdominal pain: Secondary | ICD-10-CM | POA: Diagnosis not present

## 2018-09-03 DIAGNOSIS — I499 Cardiac arrhythmia, unspecified: Secondary | ICD-10-CM | POA: Diagnosis not present

## 2018-09-03 DIAGNOSIS — R110 Nausea: Secondary | ICD-10-CM | POA: Diagnosis not present

## 2018-09-03 DIAGNOSIS — R079 Chest pain, unspecified: Secondary | ICD-10-CM | POA: Diagnosis not present

## 2018-09-03 DIAGNOSIS — K8020 Calculus of gallbladder without cholecystitis without obstruction: Secondary | ICD-10-CM | POA: Diagnosis not present

## 2018-09-04 DIAGNOSIS — F332 Major depressive disorder, recurrent severe without psychotic features: Secondary | ICD-10-CM | POA: Diagnosis not present

## 2018-09-04 DIAGNOSIS — R1033 Periumbilical pain: Secondary | ICD-10-CM | POA: Diagnosis not present

## 2018-09-05 DIAGNOSIS — M25871 Other specified joint disorders, right ankle and foot: Secondary | ICD-10-CM | POA: Diagnosis not present

## 2018-09-05 DIAGNOSIS — S82891S Other fracture of right lower leg, sequela: Secondary | ICD-10-CM | POA: Diagnosis not present

## 2018-09-05 DIAGNOSIS — T8484XA Pain due to internal orthopedic prosthetic devices, implants and grafts, initial encounter: Secondary | ICD-10-CM | POA: Diagnosis not present

## 2018-09-09 DIAGNOSIS — F332 Major depressive disorder, recurrent severe without psychotic features: Secondary | ICD-10-CM | POA: Diagnosis not present

## 2018-09-10 DIAGNOSIS — Y838 Other surgical procedures as the cause of abnormal reaction of the patient, or of later complication, without mention of misadventure at the time of the procedure: Secondary | ICD-10-CM | POA: Diagnosis not present

## 2018-09-10 DIAGNOSIS — E039 Hypothyroidism, unspecified: Secondary | ICD-10-CM | POA: Diagnosis not present

## 2018-09-10 DIAGNOSIS — M25771 Osteophyte, right ankle: Secondary | ICD-10-CM | POA: Diagnosis not present

## 2018-09-10 DIAGNOSIS — D45 Polycythemia vera: Secondary | ICD-10-CM | POA: Diagnosis not present

## 2018-09-10 DIAGNOSIS — M25871 Other specified joint disorders, right ankle and foot: Secondary | ICD-10-CM | POA: Diagnosis not present

## 2018-09-10 DIAGNOSIS — Z8614 Personal history of Methicillin resistant Staphylococcus aureus infection: Secondary | ICD-10-CM | POA: Diagnosis not present

## 2018-09-10 DIAGNOSIS — M659 Synovitis and tenosynovitis, unspecified: Secondary | ICD-10-CM | POA: Diagnosis not present

## 2018-09-10 DIAGNOSIS — Y793 Surgical instruments, materials and orthopedic devices (including sutures) associated with adverse incidents: Secondary | ICD-10-CM | POA: Diagnosis not present

## 2018-09-10 DIAGNOSIS — Z79890 Hormone replacement therapy (postmenopausal): Secondary | ICD-10-CM | POA: Diagnosis not present

## 2018-09-10 DIAGNOSIS — Z79899 Other long term (current) drug therapy: Secondary | ICD-10-CM | POA: Diagnosis not present

## 2018-09-10 DIAGNOSIS — F329 Major depressive disorder, single episode, unspecified: Secondary | ICD-10-CM | POA: Diagnosis not present

## 2018-09-10 DIAGNOSIS — K219 Gastro-esophageal reflux disease without esophagitis: Secondary | ICD-10-CM | POA: Diagnosis not present

## 2018-09-10 DIAGNOSIS — T8484XA Pain due to internal orthopedic prosthetic devices, implants and grafts, initial encounter: Secondary | ICD-10-CM | POA: Diagnosis not present

## 2018-09-10 DIAGNOSIS — G43909 Migraine, unspecified, not intractable, without status migrainosus: Secondary | ICD-10-CM | POA: Diagnosis not present

## 2018-09-10 DIAGNOSIS — M958 Other specified acquired deformities of musculoskeletal system: Secondary | ICD-10-CM | POA: Diagnosis not present

## 2018-09-12 DIAGNOSIS — F332 Major depressive disorder, recurrent severe without psychotic features: Secondary | ICD-10-CM | POA: Diagnosis not present

## 2018-09-16 DIAGNOSIS — F332 Major depressive disorder, recurrent severe without psychotic features: Secondary | ICD-10-CM | POA: Diagnosis not present

## 2018-09-22 DIAGNOSIS — K219 Gastro-esophageal reflux disease without esophagitis: Secondary | ICD-10-CM | POA: Diagnosis not present

## 2018-09-22 DIAGNOSIS — Z6829 Body mass index (BMI) 29.0-29.9, adult: Secondary | ICD-10-CM | POA: Diagnosis not present

## 2018-09-22 DIAGNOSIS — R1084 Generalized abdominal pain: Secondary | ICD-10-CM | POA: Diagnosis not present

## 2018-09-22 DIAGNOSIS — G43109 Migraine with aura, not intractable, without status migrainosus: Secondary | ICD-10-CM | POA: Diagnosis not present

## 2018-09-24 DIAGNOSIS — F332 Major depressive disorder, recurrent severe without psychotic features: Secondary | ICD-10-CM | POA: Diagnosis not present

## 2018-09-30 DIAGNOSIS — F332 Major depressive disorder, recurrent severe without psychotic features: Secondary | ICD-10-CM | POA: Diagnosis not present

## 2018-10-07 DIAGNOSIS — F332 Major depressive disorder, recurrent severe without psychotic features: Secondary | ICD-10-CM | POA: Diagnosis not present

## 2018-10-09 ENCOUNTER — Encounter (HOSPITAL_BASED_OUTPATIENT_CLINIC_OR_DEPARTMENT_OTHER): Payer: Self-pay | Admitting: *Deleted

## 2018-10-09 ENCOUNTER — Other Ambulatory Visit: Payer: Self-pay

## 2018-10-10 ENCOUNTER — Ambulatory Visit: Payer: Self-pay | Admitting: Ophthalmology

## 2018-10-10 DIAGNOSIS — F332 Major depressive disorder, recurrent severe without psychotic features: Secondary | ICD-10-CM | POA: Diagnosis not present

## 2018-10-13 DIAGNOSIS — G4733 Obstructive sleep apnea (adult) (pediatric): Secondary | ICD-10-CM | POA: Diagnosis not present

## 2018-10-13 DIAGNOSIS — H5017 Alternating exotropia with V pattern: Secondary | ICD-10-CM | POA: Diagnosis not present

## 2018-10-14 DIAGNOSIS — F332 Major depressive disorder, recurrent severe without psychotic features: Secondary | ICD-10-CM | POA: Diagnosis not present

## 2018-10-17 ENCOUNTER — Ambulatory Visit (HOSPITAL_BASED_OUTPATIENT_CLINIC_OR_DEPARTMENT_OTHER): Payer: BLUE CROSS/BLUE SHIELD | Admitting: Anesthesiology

## 2018-10-17 ENCOUNTER — Ambulatory Visit (HOSPITAL_COMMUNITY): Payer: BLUE CROSS/BLUE SHIELD

## 2018-10-17 ENCOUNTER — Other Ambulatory Visit: Payer: Self-pay

## 2018-10-17 ENCOUNTER — Ambulatory Visit (HOSPITAL_BASED_OUTPATIENT_CLINIC_OR_DEPARTMENT_OTHER)
Admission: RE | Admit: 2018-10-17 | Discharge: 2018-10-17 | Disposition: A | Discharge status: Home / Self Care | Payer: BLUE CROSS/BLUE SHIELD | Attending: Ophthalmology | Admitting: Ophthalmology

## 2018-10-17 ENCOUNTER — Encounter (HOSPITAL_BASED_OUTPATIENT_CLINIC_OR_DEPARTMENT_OTHER)
Admission: RE | Disposition: A | Discharge status: Home / Self Care | Payer: Self-pay | Source: Home / Self Care | Attending: Ophthalmology

## 2018-10-17 ENCOUNTER — Ambulatory Visit: Payer: Self-pay | Admitting: Ophthalmology

## 2018-10-17 ENCOUNTER — Encounter (HOSPITAL_BASED_OUTPATIENT_CLINIC_OR_DEPARTMENT_OTHER): Payer: Self-pay | Admitting: *Deleted

## 2018-10-17 DIAGNOSIS — T17908A Unspecified foreign body in respiratory tract, part unspecified causing other injury, initial encounter: Secondary | ICD-10-CM

## 2018-10-17 DIAGNOSIS — H5010 Unspecified exotropia: Secondary | ICD-10-CM | POA: Insufficient documentation

## 2018-10-17 DIAGNOSIS — J9811 Atelectasis: Secondary | ICD-10-CM | POA: Diagnosis not present

## 2018-10-17 DIAGNOSIS — H5017 Alternating exotropia with V pattern: Secondary | ICD-10-CM | POA: Diagnosis not present

## 2018-10-17 HISTORY — DX: Major depressive disorder, single episode, unspecified: F32.9

## 2018-10-17 HISTORY — PX: STRABISMUS SURGERY: SHX218

## 2018-10-17 HISTORY — DX: Depression, unspecified: F32.A

## 2018-10-17 SURGERY — REPAIR STRABISMUS
Anesthesia: General | Site: Eye | Laterality: Right

## 2018-10-17 MED ORDER — ALBUTEROL SULFATE (2.5 MG/3ML) 0.083% IN NEBU
INHALATION_SOLUTION | RESPIRATORY_TRACT | Status: AC
  Filled 2018-10-17: qty 3

## 2018-10-17 MED ORDER — FENTANYL CITRATE (PF) 100 MCG/2ML IJ SOLN
50.0000 ug | INTRAMUSCULAR | Status: AC | PRN
  Administered 2018-10-17 (×3): 25 ug via INTRAVENOUS
  Administered 2018-10-17: 100 ug via INTRAVENOUS
  Administered 2018-10-17: 25 ug via INTRAVENOUS

## 2018-10-17 MED ORDER — FENTANYL CITRATE (PF) 100 MCG/2ML IJ SOLN
INTRAMUSCULAR | Status: AC
  Filled 2018-10-17: qty 2

## 2018-10-17 MED ORDER — KETOROLAC TROMETHAMINE 30 MG/ML IJ SOLN
INTRAMUSCULAR | Status: AC
  Filled 2018-10-17: qty 1

## 2018-10-17 MED ORDER — PROPOFOL 10 MG/ML IV BOLUS
INTRAVENOUS | Status: DC | PRN
  Administered 2018-10-17: 100 mg via INTRAVENOUS
  Administered 2018-10-17: 200 mg via INTRAVENOUS

## 2018-10-17 MED ORDER — DEXAMETHASONE SODIUM PHOSPHATE 4 MG/ML IJ SOLN
INTRAMUSCULAR | Status: DC | PRN
  Administered 2018-10-17: 10 mg via INTRAVENOUS

## 2018-10-17 MED ORDER — GLYCOPYRROLATE PF 0.2 MG/ML IJ SOSY
PREFILLED_SYRINGE | INTRAMUSCULAR | Status: AC
  Filled 2018-10-17: qty 1

## 2018-10-17 MED ORDER — SCOPOLAMINE 1 MG/3DAYS TD PT72: 1.0000 | MEDICATED_PATCH | Freq: Once | TRANSDERMAL | Status: DC | PRN

## 2018-10-17 MED ORDER — FENTANYL CITRATE (PF) 100 MCG/2ML IJ SOLN
25.0000 ug | INTRAMUSCULAR | Status: DC | PRN
  Administered 2018-10-17 (×2): 50 ug via INTRAVENOUS

## 2018-10-17 MED ORDER — KETOROLAC TROMETHAMINE 30 MG/ML IJ SOLN
INTRAMUSCULAR | Status: DC | PRN
  Administered 2018-10-17: 30 mg via INTRAVENOUS

## 2018-10-17 MED ORDER — GLYCOPYRROLATE 0.2 MG/ML IJ SOLN
INTRAMUSCULAR | Status: DC | PRN
  Administered 2018-10-17: .2 mg via INTRAVENOUS

## 2018-10-17 MED ORDER — SUCCINYLCHOLINE CHLORIDE 200 MG/10ML IV SOSY
PREFILLED_SYRINGE | INTRAVENOUS | Status: AC
  Filled 2018-10-17: qty 10

## 2018-10-17 MED ORDER — SUCCINYLCHOLINE CHLORIDE 20 MG/ML IJ SOLN
INTRAMUSCULAR | Status: DC | PRN
  Administered 2018-10-17: 120 mg via INTRAVENOUS

## 2018-10-17 MED ORDER — LIDOCAINE HCL (CARDIAC) PF 100 MG/5ML IV SOSY
PREFILLED_SYRINGE | INTRAVENOUS | Status: DC | PRN
  Administered 2018-10-17: 50 mg via INTRAVENOUS

## 2018-10-17 MED ORDER — MIDAZOLAM HCL 2 MG/2ML IJ SOLN
1.0000 mg | INTRAMUSCULAR | Status: DC | PRN
  Administered 2018-10-17: 2 mg via INTRAVENOUS

## 2018-10-17 MED ORDER — ONDANSETRON HCL 4 MG/2ML IJ SOLN
INTRAMUSCULAR | Status: AC
  Filled 2018-10-17: qty 2

## 2018-10-17 MED ORDER — ALBUTEROL SULFATE (2.5 MG/3ML) 0.083% IN NEBU
2.5000 mg | INHALATION_SOLUTION | Freq: Four times a day (QID) | RESPIRATORY_TRACT | Status: DC | PRN
  Administered 2018-10-17: 2.5 mg via RESPIRATORY_TRACT

## 2018-10-17 MED ORDER — MIDAZOLAM HCL 2 MG/2ML IJ SOLN
INTRAMUSCULAR | Status: AC
  Filled 2018-10-17: qty 2

## 2018-10-17 MED ORDER — LACTATED RINGERS IV SOLN
INTRAVENOUS | Status: DC
  Administered 2018-10-17 (×2): via INTRAVENOUS

## 2018-10-17 MED ORDER — DEXAMETHASONE SODIUM PHOSPHATE 10 MG/ML IJ SOLN
INTRAMUSCULAR | Status: AC
  Filled 2018-10-17: qty 1

## 2018-10-17 MED ORDER — TOBRAMYCIN-DEXAMETHASONE 0.3-0.1 % OP OINT
TOPICAL_OINTMENT | OPHTHALMIC | Status: DC | PRN
  Administered 2018-10-17: 1 via OPHTHALMIC

## 2018-10-17 MED ORDER — LIDOCAINE 2% (20 MG/ML) 5 ML SYRINGE
INTRAMUSCULAR | Status: AC
  Filled 2018-10-17: qty 5

## 2018-10-17 SURGICAL SUPPLY — 33 items
APL SRG 3 HI ABS STRL LF PLS (MISCELLANEOUS) ×2
APL SWBSTK 6 STRL LF DISP (MISCELLANEOUS) ×8
APPLICATOR COTTON TIP 6 STRL (MISCELLANEOUS) ×8 IMPLANT
APPLICATOR COTTON TIP 6IN STRL (MISCELLANEOUS) ×12
APPLICATOR DR MATTHEWS STRL (MISCELLANEOUS) ×3 IMPLANT
BNDG EYE OVAL (GAUZE/BANDAGES/DRESSINGS) IMPLANT
CAUTERY EYE LOW TEMP 1300F FIN (OPHTHALMIC RELATED) IMPLANT
COVER BACK TABLE 60X90IN (DRAPES) ×3 IMPLANT
COVER MAYO STAND STRL (DRAPES) ×3 IMPLANT
COVER WAND RF STERILE (DRAPES) IMPLANT
DRAPE SURG 17X23 STRL (DRAPES) ×6 IMPLANT
DRAPE U-SHAPE 76X120 STRL (DRAPES) ×2 IMPLANT
GLOVE BIO SURGEON STRL SZ 6.5 (GLOVE) ×8 IMPLANT
GLOVE BIOGEL M STRL SZ7.5 (GLOVE) ×3 IMPLANT
GOWN STRL REUS W/ TWL LRG LVL3 (GOWN DISPOSABLE) ×3 IMPLANT
GOWN STRL REUS W/TWL LRG LVL3 (GOWN DISPOSABLE) ×6
GOWN STRL REUS W/TWL XL LVL3 (GOWN DISPOSABLE) ×3 IMPLANT
NS IRRIG 1000ML POUR BTL (IV SOLUTION) ×3 IMPLANT
PACK BASIN DAY SURGERY FS (CUSTOM PROCEDURE TRAY) ×3 IMPLANT
SHEET MEDIUM DRAPE 40X70 STRL (DRAPES) IMPLANT
SPEAR EYE SURG WECK-CEL (MISCELLANEOUS) ×6 IMPLANT
STRIP CLOSURE SKIN 1/4X4 (GAUZE/BANDAGES/DRESSINGS) IMPLANT
SUT 6 0 SILK T G140 8DA (SUTURE) IMPLANT
SUT MERSILENE 6-0 18IN S14 8MM (SUTURE)
SUT PLAIN 6 0 TG1408 (SUTURE) ×2 IMPLANT
SUT SILK 4 0 C 3 735G (SUTURE) IMPLANT
SUT VICRYL 6 0 S 28 (SUTURE) ×2 IMPLANT
SUT VICRYL ABS 6-0 S29 18IN (SUTURE) ×2 IMPLANT
SUTURE MERSLN 6-0 18IN S14 8MM (SUTURE) IMPLANT
SYR 10ML LL (SYRINGE) ×3 IMPLANT
SYR TB 1ML LL NO SAFETY (SYRINGE) ×3 IMPLANT
TOWEL GREEN STERILE FF (TOWEL DISPOSABLE) ×3 IMPLANT
TRAY DSU PREP LF (CUSTOM PROCEDURE TRAY) ×3 IMPLANT

## 2018-10-17 NOTE — Op Note (Signed)
10/17/2018  10:21 AM  PATIENT:  Charles Coleman    PRE-OPERATIVE DIAGNOSIS:  Exotropia, recurrent by history  POST-OPERATIVE DIAGNOSIS: Exotropia, consecutive  PROCEDURE:  1. Lateral rectus muscle recession 7.0 mm right eye   2.  Medial rectus muscle advancement 4.0 mm right eye    SURGEON:  Charles BlazingWilliam O Emmeline Winebarger, MD  ANESTHESIA:   General  COMPLICATIONS: none  OPERATIVE PROCEDURE: After routine preoperative evaluation including informed consent, the patient was taken to the operating room where He was identified by me. General anesthesia was induced without difficulty after placement of appropriate monitors. The patient was prepped and draped in standard sterile fashion. A lid speculum was placed in the right eye. The conjunctiva was mobile, but there was evidence of a precious conjunctival incision in the inferonasal and inferotemporal quadrant.  Wallace CullensGray sclera was visible posterior to the medial rectus muscle, suggesting that the medial rectus muscle may have been recessed (inconsistent with the patient's recollection that his initial strabismus surgery was for exotropia).  Through an inferotemporal fornix incision, the right lateral rectus muscle was engaged on a series of muscle hooks and cleared of its fascial attachments and scar tissue.  The muscle was found inserted 7 mm posterior to the limbus.The muscle was secured with a double-armed 6-0 Vicryl suture, with a locking bite at each border of the muscle, 1 mm from the insertion. The muscle was disinserted.  It was reattached to sclera at a measured distance of 7.0 mm posterior to the current insertion, using direct scleral passes in crossed swords fashion. The suture ends were tied securely after the position of the muscle had been checked and found to be accurate. Conjunctiva was closed with a single 6-0 plain gut suture.  Through an inferonasal fornix incision through conjunctiva and Tenon's fascia, the right medial rectus muscle was engaged on  a series of muscle hookscarefully cleared of its fascial attachments and scar tissue.  The muscle was found inserted 9 mm posterior to the limbus. A 2 mm bite was taken of the center of the muscle belly 2 mm posterior to the insertion, and a knot was tied securely at this location. The needle at each end of the double-armed suture was passed from the center of the muscle belly to the periphery, parallel to and 2 mm posterior to the insertion. The muscle was disinserted. Each pole suture was passed into the original insertion, 5 mm posterior to the limbus, in crossed swords fashion.   The muscle was drawn up to the level of the original insertion, and all slack was removed.  The suture ends were tied securely.One needle was then passed through the center of the muscle belly just posterior to the previously placed knot, and the suture ends were again tied securely. Conjunctiva was closed with a single 6-0 Vicryl suture. Tobradex ophthalmic ointment was placed in right eye. The patient was awakened without difficulty and taken to the recovery room in stable condition, having suffered no intraoperative or immediate postoperative complications. Charles Coleman.  Kinzly Pierrelouis O Missi Mcmackin, MD

## 2018-10-17 NOTE — Anesthesia Preprocedure Evaluation (Signed)
Anesthesia Evaluation  Patient identified by MRN, date of birth, ID band Patient awake    Reviewed: Allergy & Precautions, NPO status , Patient's Chart, lab work & pertinent test results  Airway Mallampati: II  TM Distance: >3 FB     Dental   Pulmonary    breath sounds clear to auscultation       Cardiovascular negative cardio ROS   Rhythm:Regular Rate:Normal     Neuro/Psych  Headaches,    GI/Hepatic negative GI ROS, Neg liver ROS,   Endo/Other  Hypothyroidism   Renal/GU negative Renal ROS     Musculoskeletal   Abdominal   Peds  Hematology   Anesthesia Other Findings   Reproductive/Obstetrics                             Anesthesia Physical Anesthesia Plan  ASA: III  Anesthesia Plan:    Post-op Pain Management:    Induction: Intravenous  PONV Risk Score and Plan: Ondansetron, Dexamethasone and Midazolam  Airway Management Planned: LMA  Additional Equipment:   Intra-op Plan:   Post-operative Plan: Extubation in OR  Informed Consent: I have reviewed the patients History and Physical, chart, labs and discussed the procedure including the risks, benefits and alternatives for the proposed anesthesia with the patient or authorized representative who has indicated his/her understanding and acceptance.   Dental advisory given  Plan Discussed with: CRNA and Anesthesiologist  Anesthesia Plan Comments:         Anesthesia Quick Evaluation

## 2018-10-17 NOTE — Anesthesia Postprocedure Evaluation (Signed)
Anesthesia Post Note  Patient: Charles Coleman  Procedure(s) Performed: REPAIR STRABISMUS RIGHT EYE (Right Eye)     Patient location during evaluation: PACU Anesthesia Type: General Level of consciousness: awake Pain management: pain level controlled Vital Signs Assessment: post-procedure vital signs reviewed and stable Respiratory status: spontaneous breathing Cardiovascular status: stable Postop Assessment: no apparent nausea or vomiting Anesthetic complications: no    Last Vitals:  Vitals:   10/17/18 1600 10/17/18 1615  BP: 138/82 130/81  Pulse: 97 (!) 102  Resp:    Temp:    SpO2: 96% 95%    Last Pain:  Vitals:   10/17/18 1600  TempSrc:   PainSc: 2                  Kerrianne Jeng

## 2018-10-17 NOTE — H&P (Signed)
Date of examination:  10-13-18  Indication for surgery: to straighten the eyes and allow some binocularity  Pertinent past medical history:  Past Medical History:  Diagnosis Date  . Bipolar disorder (HCC)   . Depression   . Headache(784.0)   . Tail bone pain    fracture    Pertinent ocular history:  S/p surgery for exotropia by history, records unavailable  Pertinent family history:  Family History  Problem Relation Age of Onset  . Heart failure Mother   . Alcohol abuse Mother   . Drug abuse Mother   . Depression Mother   . Alcohol abuse Father   . Drug abuse Father   . Depression Father   . Alcohol abuse Sister   . Drug abuse Sister   . Alcohol abuse Brother   . Drug abuse Brother   . Alcohol abuse Sister   . Drug abuse Sister   . Alcohol abuse Sister   . Drug abuse Sister   . Alcohol abuse Brother   . Drug abuse Brother   . Alcohol abuse Brother   . Drug abuse Brother     General:  Healthy appearing patient in no distress.    Eyes:    Acuity  cc OD 20/20  OS 20/20  External: Within normal limits     Anterior segment: no conj scars seen os.  Conj scar temporally od, ?nasally od  Motility:   XT=35, + "V" with apparent IO OA OU but no hyper in side gaze.and no extorsion seen.  Horizontally comitant  Fundus: Normal   undil  Refraction:  Low plus OU  Heart: Regular rate and rhythm without murmur     Lungs: Clear to auscultation        Impression:exotropia, "V" pattern, recurrent by hx, s/p previous strabismus surgery, details unknown  Plan: Explore RLR and RMR, consider re-recess RLR and re-resect RMR vs. Recess-resect on OS  Pasty SpillersWilliam O Ahley Bulls

## 2018-10-17 NOTE — Discharge Instructions (Signed)
Diet: Clear liquids, advance to soft foods then regular diet as tolerated by this evening.  Pain control:   1)  Ibuprofen 600 mg by mouth every 6-8 hours as needed for pain  2)  Ice pack/cold compress to operated eye(s) as desired  Eye medications:   Tobradex or Zylet eye ointment 1/2 inch in operated eye twice a day for one week  Activity: No swimming for 1 week.  It is OK to let water run over the face and eyes while showering or taking a bath, even during the first week.  No other restriction on exercise or activity.  Call Dr. Roxy CedarYoung's office [phone] with any problems or concerns.    Post Anesthesia Home Care Instructions  Activity: Get plenty of rest for the remainder of the day. A responsible individual must stay with you for 24 hours following the procedure.  For the next 24 hours, DO NOT: -Drive a car -Advertising copywriterperate machinery -Drink alcoholic beverages -Take any medication unless instructed by your physician -Make any legal decisions or sign important papers.  Meals: Start with liquid foods such as gelatin or soup. Progress to regular foods as tolerated. Avoid greasy, spicy, heavy foods. If nausea and/or vomiting occur, drink only clear liquids until the nausea and/or vomiting subsides. Call your physician if vomiting continues.  Special Instructions/Symptoms: Your throat may feel dry or sore from the anesthesia or the breathing tube placed in your throat during surgery. If this causes discomfort, gargle with warm salt water. The discomfort should disappear within 24 hours.  If you had a scopolamine patch placed behind your ear for the management of post- operative nausea and/or vomiting:  1. The medication in the patch is effective for 72 hours, after which it should be removed.  Wrap patch in a tissue and discard in the trash. Wash hands thoroughly with soap and water. 2. You may remove the patch earlier than 72 hours if you experience unpleasant side effects which may  include dry mouth, dizziness or visual disturbances. 3. Avoid touching the patch. Wash your hands with soap and water after contact with the patch.

## 2018-10-17 NOTE — Anesthesia Procedure Notes (Signed)
Procedure Name: LMA Insertion Performed by: Aariona Momon W, CRNA Pre-anesthesia Checklist: Patient identified, Emergency Drugs available, Suction available and Patient being monitored Patient Re-evaluated:Patient Re-evaluated prior to induction Oxygen Delivery Method: Circle system utilized Preoxygenation: Pre-oxygenation with 100% oxygen Induction Type: IV induction Ventilation: Mask ventilation without difficulty LMA: LMA flexible inserted LMA Size: 4.0 Number of attempts: 1 Placement Confirmation: positive ETCO2 Tube secured with: Tape Dental Injury: Teeth and Oropharynx as per pre-operative assessment        

## 2018-10-17 NOTE — Transfer of Care (Signed)
Immediate Anesthesia Transfer of Care Note  Patient: Charles Coleman  Procedure(s) Performed: REPAIR STRABISMUS RIGHT EYE (Right Eye)  Patient Location: PACU  Anesthesia Type:General  Level of Consciousness: awake and sedated  Airway & Oxygen Therapy: Patient Spontanous Breathing and Patient connected to face mask oxygen  Post-op Assessment: Report given to RN and Post -op Vital signs reviewed and stable  Post vital signs: Reviewed and stable  Last Vitals:  Vitals Value Taken Time  BP 153/106 10/17/2018 10:32 AM  Temp    Pulse 99 10/17/2018 10:33 AM  Resp 17 10/17/2018 10:33 AM  SpO2 96 % 10/17/2018 10:33 AM  Vitals shown include unvalidated device data.  Last Pain:  Vitals:   10/17/18 0817  TempSrc: Oral  PainSc: 5       Patients Stated Pain Goal: 2 (10/17/18 0817)  Complications: No apparent anesthesia complications

## 2018-10-17 NOTE — H&P (Deleted)
  The note originally documented on this encounter has been moved the the encounter in which it belongs.  

## 2018-10-17 NOTE — Anesthesia Procedure Notes (Signed)
Procedure Name: Intubation Performed by: Zana Biancardi W, CRNA Pre-anesthesia Checklist: Patient identified, Emergency Drugs available, Suction available and Patient being monitored Patient Re-evaluated:Patient Re-evaluated prior to induction Oxygen Delivery Method: Circle system utilized Preoxygenation: Pre-oxygenation with 100% oxygen Induction Type: IV induction Ventilation: Mask ventilation without difficulty Laryngoscope Size: Miller and 2 Grade View: Grade I Tube type: Oral Tube size: 7.0 mm Number of attempts: 1 Airway Equipment and Method: Stylet Placement Confirmation: ETT inserted through vocal cords under direct vision,  positive ETCO2 and breath sounds checked- equal and bilateral Secured at: 22 cm Tube secured with: Tape Dental Injury: Teeth and Oropharynx as per pre-operative assessment        

## 2018-10-20 ENCOUNTER — Encounter (HOSPITAL_BASED_OUTPATIENT_CLINIC_OR_DEPARTMENT_OTHER): Payer: Self-pay | Admitting: Ophthalmology

## 2018-10-20 NOTE — Addendum Note (Signed)
Addendum  created 10/20/18 0945 by Lance CoonWebster, Mustang, CRNA   Charge Capture section accepted

## 2018-10-21 DIAGNOSIS — F332 Major depressive disorder, recurrent severe without psychotic features: Secondary | ICD-10-CM | POA: Diagnosis not present

## 2018-10-23 DIAGNOSIS — F332 Major depressive disorder, recurrent severe without psychotic features: Secondary | ICD-10-CM | POA: Diagnosis not present

## 2018-10-24 DIAGNOSIS — G4733 Obstructive sleep apnea (adult) (pediatric): Secondary | ICD-10-CM | POA: Diagnosis not present

## 2018-10-30 DIAGNOSIS — F332 Major depressive disorder, recurrent severe without psychotic features: Secondary | ICD-10-CM | POA: Diagnosis not present

## 2018-11-04 DIAGNOSIS — F332 Major depressive disorder, recurrent severe without psychotic features: Secondary | ICD-10-CM | POA: Diagnosis not present

## 2018-11-04 DIAGNOSIS — M25871 Other specified joint disorders, right ankle and foot: Secondary | ICD-10-CM | POA: Diagnosis not present

## 2018-11-04 DIAGNOSIS — T8484XA Pain due to internal orthopedic prosthetic devices, implants and grafts, initial encounter: Secondary | ICD-10-CM | POA: Diagnosis not present

## 2018-11-07 DIAGNOSIS — F332 Major depressive disorder, recurrent severe without psychotic features: Secondary | ICD-10-CM | POA: Diagnosis not present

## 2018-11-07 DIAGNOSIS — F3342 Major depressive disorder, recurrent, in full remission: Secondary | ICD-10-CM | POA: Diagnosis not present

## 2018-11-24 DIAGNOSIS — G4733 Obstructive sleep apnea (adult) (pediatric): Secondary | ICD-10-CM | POA: Diagnosis not present

## 2018-11-24 DIAGNOSIS — F332 Major depressive disorder, recurrent severe without psychotic features: Secondary | ICD-10-CM | POA: Diagnosis not present

## 2018-12-03 DIAGNOSIS — F332 Major depressive disorder, recurrent severe without psychotic features: Secondary | ICD-10-CM | POA: Diagnosis not present

## 2018-12-25 DIAGNOSIS — G4733 Obstructive sleep apnea (adult) (pediatric): Secondary | ICD-10-CM | POA: Diagnosis not present

## 2018-12-25 DIAGNOSIS — F332 Major depressive disorder, recurrent severe without psychotic features: Secondary | ICD-10-CM | POA: Diagnosis not present

## 2018-12-31 DIAGNOSIS — F332 Major depressive disorder, recurrent severe without psychotic features: Secondary | ICD-10-CM | POA: Diagnosis not present

## 2019-01-13 DIAGNOSIS — G43109 Migraine with aura, not intractable, without status migrainosus: Secondary | ICD-10-CM | POA: Diagnosis not present

## 2019-01-13 DIAGNOSIS — R51 Headache: Secondary | ICD-10-CM | POA: Diagnosis not present

## 2019-01-15 DIAGNOSIS — F332 Major depressive disorder, recurrent severe without psychotic features: Secondary | ICD-10-CM | POA: Diagnosis not present

## 2019-01-23 DIAGNOSIS — G4733 Obstructive sleep apnea (adult) (pediatric): Secondary | ICD-10-CM | POA: Diagnosis not present

## 2019-01-28 DIAGNOSIS — F332 Major depressive disorder, recurrent severe without psychotic features: Secondary | ICD-10-CM | POA: Diagnosis not present

## 2019-02-03 DIAGNOSIS — F332 Major depressive disorder, recurrent severe without psychotic features: Secondary | ICD-10-CM | POA: Diagnosis not present

## 2019-02-16 DIAGNOSIS — D751 Secondary polycythemia: Secondary | ICD-10-CM | POA: Diagnosis not present

## 2019-02-16 DIAGNOSIS — E039 Hypothyroidism, unspecified: Secondary | ICD-10-CM | POA: Diagnosis not present

## 2019-02-16 DIAGNOSIS — E291 Testicular hypofunction: Secondary | ICD-10-CM | POA: Diagnosis not present

## 2019-02-17 DIAGNOSIS — F332 Major depressive disorder, recurrent severe without psychotic features: Secondary | ICD-10-CM | POA: Diagnosis not present

## 2019-02-20 DIAGNOSIS — F332 Major depressive disorder, recurrent severe without psychotic features: Secondary | ICD-10-CM | POA: Diagnosis not present

## 2019-03-03 DIAGNOSIS — F332 Major depressive disorder, recurrent severe without psychotic features: Secondary | ICD-10-CM | POA: Diagnosis not present

## 2019-03-10 DIAGNOSIS — F332 Major depressive disorder, recurrent severe without psychotic features: Secondary | ICD-10-CM | POA: Diagnosis not present

## 2019-03-20 DIAGNOSIS — F332 Major depressive disorder, recurrent severe without psychotic features: Secondary | ICD-10-CM | POA: Diagnosis not present

## 2019-03-24 DIAGNOSIS — F332 Major depressive disorder, recurrent severe without psychotic features: Secondary | ICD-10-CM | POA: Diagnosis not present

## 2019-03-24 DIAGNOSIS — F4321 Adjustment disorder with depressed mood: Secondary | ICD-10-CM | POA: Diagnosis not present

## 2019-04-03 DIAGNOSIS — F332 Major depressive disorder, recurrent severe without psychotic features: Secondary | ICD-10-CM | POA: Diagnosis not present

## 2019-04-21 DIAGNOSIS — F332 Major depressive disorder, recurrent severe without psychotic features: Secondary | ICD-10-CM | POA: Diagnosis not present

## 2019-04-28 DIAGNOSIS — F332 Major depressive disorder, recurrent severe without psychotic features: Secondary | ICD-10-CM | POA: Diagnosis not present

## 2019-05-07 DIAGNOSIS — F332 Major depressive disorder, recurrent severe without psychotic features: Secondary | ICD-10-CM | POA: Diagnosis not present

## 2019-05-11 DIAGNOSIS — F332 Major depressive disorder, recurrent severe without psychotic features: Secondary | ICD-10-CM | POA: Diagnosis not present

## 2019-05-15 DIAGNOSIS — F332 Major depressive disorder, recurrent severe without psychotic features: Secondary | ICD-10-CM | POA: Diagnosis not present

## 2019-05-20 DIAGNOSIS — F332 Major depressive disorder, recurrent severe without psychotic features: Secondary | ICD-10-CM | POA: Diagnosis not present

## 2019-05-22 DIAGNOSIS — F332 Major depressive disorder, recurrent severe without psychotic features: Secondary | ICD-10-CM | POA: Diagnosis not present

## 2019-05-26 DIAGNOSIS — E039 Hypothyroidism, unspecified: Secondary | ICD-10-CM | POA: Diagnosis not present

## 2019-05-26 DIAGNOSIS — E291 Testicular hypofunction: Secondary | ICD-10-CM | POA: Diagnosis not present

## 2019-05-29 DIAGNOSIS — R110 Nausea: Secondary | ICD-10-CM | POA: Diagnosis not present

## 2019-05-29 DIAGNOSIS — J029 Acute pharyngitis, unspecified: Secondary | ICD-10-CM | POA: Diagnosis not present

## 2019-05-29 DIAGNOSIS — R197 Diarrhea, unspecified: Secondary | ICD-10-CM | POA: Diagnosis not present

## 2019-05-29 DIAGNOSIS — Z20828 Contact with and (suspected) exposure to other viral communicable diseases: Secondary | ICD-10-CM | POA: Diagnosis not present

## 2019-06-01 DIAGNOSIS — R110 Nausea: Secondary | ICD-10-CM | POA: Diagnosis not present

## 2019-06-01 DIAGNOSIS — R197 Diarrhea, unspecified: Secondary | ICD-10-CM | POA: Diagnosis not present

## 2019-06-01 DIAGNOSIS — J029 Acute pharyngitis, unspecified: Secondary | ICD-10-CM | POA: Diagnosis not present

## 2019-06-01 DIAGNOSIS — Z20828 Contact with and (suspected) exposure to other viral communicable diseases: Secondary | ICD-10-CM | POA: Diagnosis not present

## 2019-06-02 DIAGNOSIS — F332 Major depressive disorder, recurrent severe without psychotic features: Secondary | ICD-10-CM | POA: Diagnosis not present

## 2019-06-04 DIAGNOSIS — F332 Major depressive disorder, recurrent severe without psychotic features: Secondary | ICD-10-CM | POA: Diagnosis not present

## 2019-06-17 DIAGNOSIS — G43109 Migraine with aura, not intractable, without status migrainosus: Secondary | ICD-10-CM | POA: Diagnosis not present

## 2019-06-22 DIAGNOSIS — F332 Major depressive disorder, recurrent severe without psychotic features: Secondary | ICD-10-CM | POA: Diagnosis not present

## 2019-07-06 DIAGNOSIS — F332 Major depressive disorder, recurrent severe without psychotic features: Secondary | ICD-10-CM | POA: Diagnosis not present

## 2019-08-12 DIAGNOSIS — G8929 Other chronic pain: Secondary | ICD-10-CM | POA: Diagnosis not present

## 2019-12-17 IMAGING — CR DG CHEST 1V PORT
1 series · 1 of 1 positions shown · non-contrast
Comparison: Chest x-ray dated September 03, 2018.

CLINICAL DATA: Possible aspiration.

EXAM:
PORTABLE CHEST 1 VIEW

[chest ap]
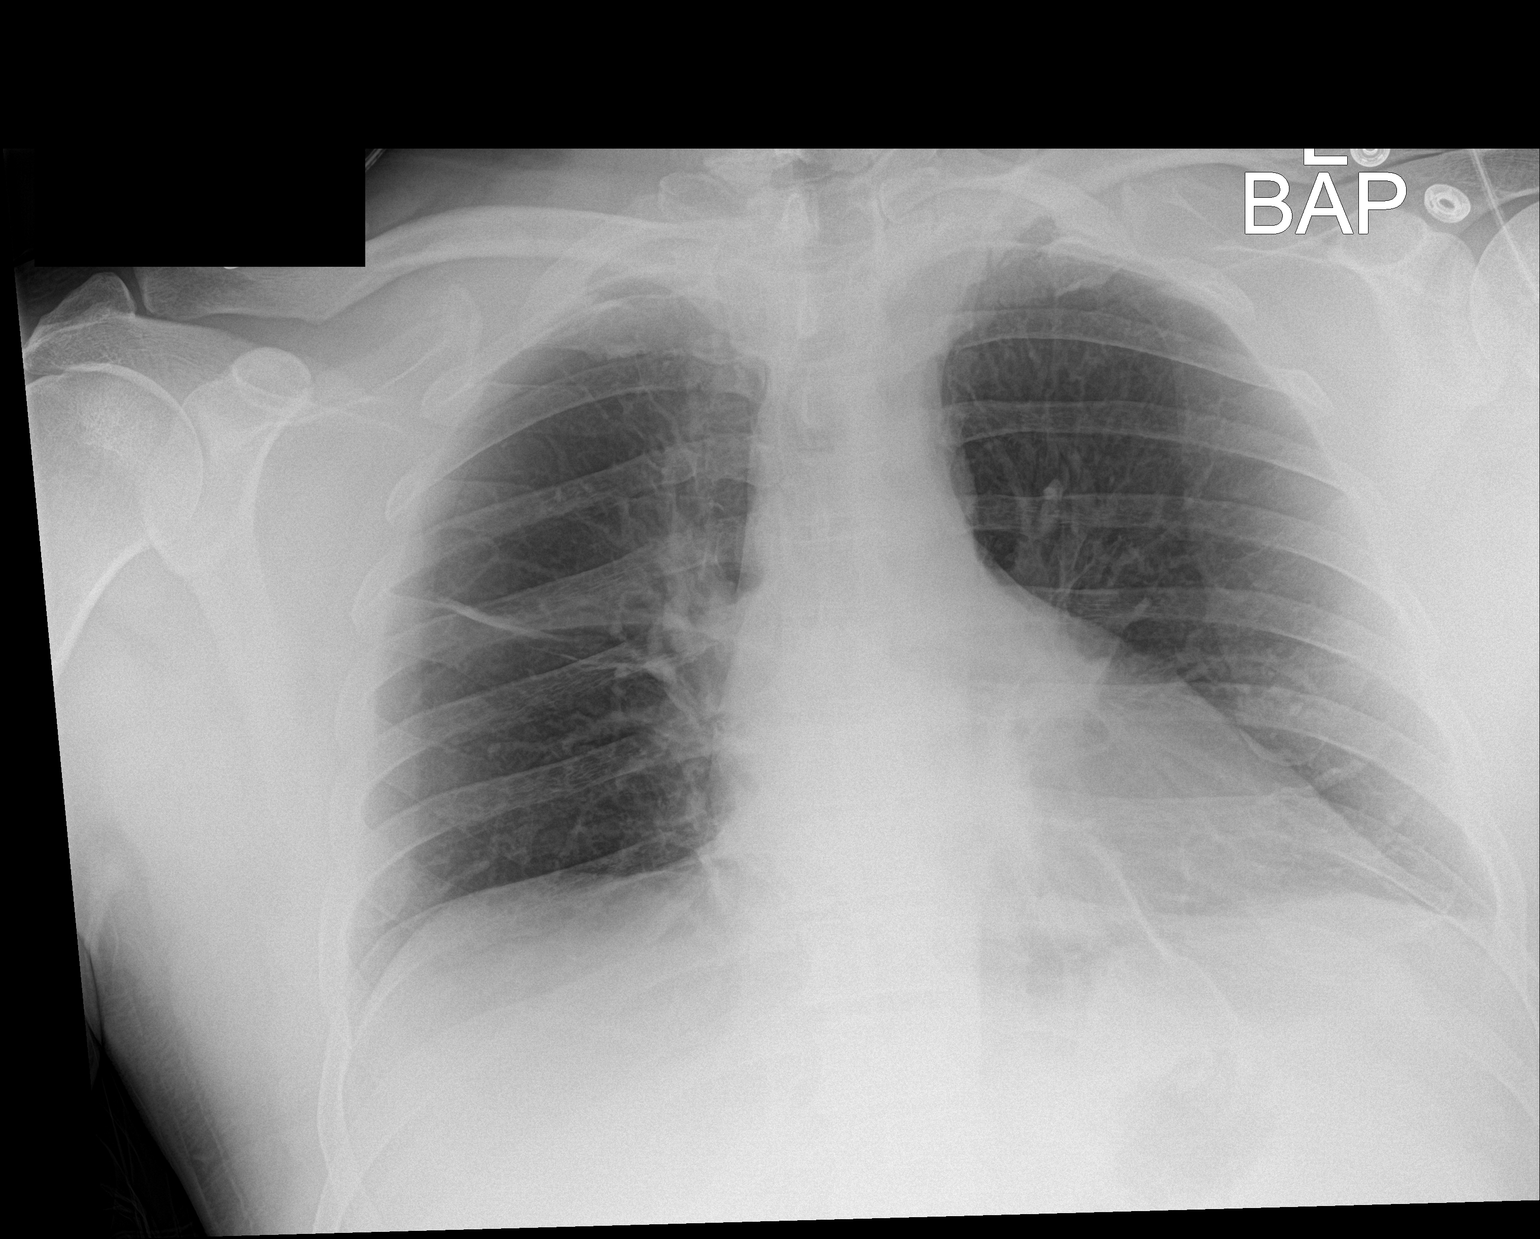

[1 of 1 positions shown; findings below may reference images not displayed]

FINDINGS: The heart size and mediastinal contours are within normal limits.
Normal pulmonary vascularity. Linear atelectasis in the right mid
lung and left lower lobe. No focal consolidation, pleural effusion,
or pneumothorax. No acute osseous abnormality.
IMPRESSION: No active disease.

These results will be called to the ordering clinician or
representative by the Radiologist Assistant, and communication
documented in the PACS or zVision Dashboard.

## 2020-10-03 DIAGNOSIS — Q272 Other congenital malformations of renal artery: Secondary | ICD-10-CM | POA: Diagnosis not present

## 2020-10-03 DIAGNOSIS — R079 Chest pain, unspecified: Secondary | ICD-10-CM | POA: Diagnosis not present

## 2020-10-03 DIAGNOSIS — I10 Essential (primary) hypertension: Secondary | ICD-10-CM | POA: Diagnosis not present

## 2020-10-03 DIAGNOSIS — R457 State of emotional shock and stress, unspecified: Secondary | ICD-10-CM | POA: Diagnosis not present

## 2020-10-03 DIAGNOSIS — Q638 Other specified congenital malformations of kidney: Secondary | ICD-10-CM | POA: Diagnosis not present

## 2020-10-03 DIAGNOSIS — R0789 Other chest pain: Secondary | ICD-10-CM | POA: Diagnosis not present

## 2020-10-03 DIAGNOSIS — K838 Other specified diseases of biliary tract: Secondary | ICD-10-CM | POA: Diagnosis not present

## 2020-10-03 DIAGNOSIS — K8020 Calculus of gallbladder without cholecystitis without obstruction: Secondary | ICD-10-CM | POA: Diagnosis not present

## 2020-10-03 DIAGNOSIS — R600 Localized edema: Secondary | ICD-10-CM | POA: Diagnosis not present

## 2020-10-25 DIAGNOSIS — F332 Major depressive disorder, recurrent severe without psychotic features: Secondary | ICD-10-CM | POA: Diagnosis not present

## 2020-12-02 DIAGNOSIS — F3341 Major depressive disorder, recurrent, in partial remission: Secondary | ICD-10-CM | POA: Diagnosis not present

## 2020-12-08 DIAGNOSIS — E039 Hypothyroidism, unspecified: Secondary | ICD-10-CM | POA: Diagnosis not present

## 2020-12-08 DIAGNOSIS — D751 Secondary polycythemia: Secondary | ICD-10-CM | POA: Diagnosis not present

## 2020-12-08 DIAGNOSIS — E291 Testicular hypofunction: Secondary | ICD-10-CM | POA: Diagnosis not present

## 2020-12-23 DIAGNOSIS — E039 Hypothyroidism, unspecified: Secondary | ICD-10-CM | POA: Diagnosis not present

## 2020-12-28 DIAGNOSIS — M25571 Pain in right ankle and joints of right foot: Secondary | ICD-10-CM | POA: Diagnosis not present

## 2020-12-28 DIAGNOSIS — G8929 Other chronic pain: Secondary | ICD-10-CM | POA: Diagnosis not present

## 2020-12-28 DIAGNOSIS — G894 Chronic pain syndrome: Secondary | ICD-10-CM | POA: Diagnosis not present

## 2020-12-28 DIAGNOSIS — M25871 Other specified joint disorders, right ankle and foot: Secondary | ICD-10-CM | POA: Diagnosis not present

## 2021-01-31 DIAGNOSIS — F332 Major depressive disorder, recurrent severe without psychotic features: Secondary | ICD-10-CM | POA: Diagnosis not present

## 2021-02-15 DIAGNOSIS — F332 Major depressive disorder, recurrent severe without psychotic features: Secondary | ICD-10-CM | POA: Diagnosis not present

## 2021-02-22 DIAGNOSIS — F410 Panic disorder [episodic paroxysmal anxiety] without agoraphobia: Secondary | ICD-10-CM | POA: Diagnosis not present

## 2021-02-22 DIAGNOSIS — F332 Major depressive disorder, recurrent severe without psychotic features: Secondary | ICD-10-CM | POA: Diagnosis not present

## 2021-03-06 DIAGNOSIS — Z79891 Long term (current) use of opiate analgesic: Secondary | ICD-10-CM | POA: Diagnosis not present

## 2021-03-06 DIAGNOSIS — G894 Chronic pain syndrome: Secondary | ICD-10-CM | POA: Diagnosis not present

## 2021-03-06 DIAGNOSIS — G8929 Other chronic pain: Secondary | ICD-10-CM | POA: Diagnosis not present

## 2021-07-27 DIAGNOSIS — G43109 Migraine with aura, not intractable, without status migrainosus: Secondary | ICD-10-CM | POA: Diagnosis not present

## 2021-08-17 DIAGNOSIS — F3341 Major depressive disorder, recurrent, in partial remission: Secondary | ICD-10-CM | POA: Diagnosis not present

## 2021-08-17 DIAGNOSIS — F900 Attention-deficit hyperactivity disorder, predominantly inattentive type: Secondary | ICD-10-CM | POA: Diagnosis not present

## 2021-09-13 DIAGNOSIS — D751 Secondary polycythemia: Secondary | ICD-10-CM | POA: Diagnosis not present

## 2021-09-13 DIAGNOSIS — E291 Testicular hypofunction: Secondary | ICD-10-CM | POA: Diagnosis not present

## 2021-09-13 DIAGNOSIS — E039 Hypothyroidism, unspecified: Secondary | ICD-10-CM | POA: Diagnosis not present

## 2021-09-25 DIAGNOSIS — F410 Panic disorder [episodic paroxysmal anxiety] without agoraphobia: Secondary | ICD-10-CM | POA: Diagnosis not present

## 2021-09-25 DIAGNOSIS — F332 Major depressive disorder, recurrent severe without psychotic features: Secondary | ICD-10-CM | POA: Diagnosis not present

## 2021-11-08 DIAGNOSIS — F332 Major depressive disorder, recurrent severe without psychotic features: Secondary | ICD-10-CM | POA: Diagnosis not present

## 2021-11-15 DIAGNOSIS — F900 Attention-deficit hyperactivity disorder, predominantly inattentive type: Secondary | ICD-10-CM | POA: Diagnosis not present

## 2021-11-15 DIAGNOSIS — F410 Panic disorder [episodic paroxysmal anxiety] without agoraphobia: Secondary | ICD-10-CM | POA: Diagnosis not present

## 2021-11-15 DIAGNOSIS — F332 Major depressive disorder, recurrent severe without psychotic features: Secondary | ICD-10-CM | POA: Diagnosis not present

## 2021-11-15 DIAGNOSIS — G4700 Insomnia, unspecified: Secondary | ICD-10-CM | POA: Diagnosis not present

## 2022-06-12 DIAGNOSIS — F341 Dysthymic disorder: Secondary | ICD-10-CM | POA: Diagnosis not present

## 2022-06-12 DIAGNOSIS — F411 Generalized anxiety disorder: Secondary | ICD-10-CM | POA: Diagnosis not present

## 2022-07-12 DIAGNOSIS — E291 Testicular hypofunction: Secondary | ICD-10-CM | POA: Diagnosis not present

## 2022-07-12 DIAGNOSIS — D751 Secondary polycythemia: Secondary | ICD-10-CM | POA: Diagnosis not present

## 2022-08-21 DIAGNOSIS — M19071 Primary osteoarthritis, right ankle and foot: Secondary | ICD-10-CM | POA: Diagnosis not present

## 2022-08-21 DIAGNOSIS — T8484XA Pain due to internal orthopedic prosthetic devices, implants and grafts, initial encounter: Secondary | ICD-10-CM | POA: Diagnosis not present

## 2022-08-21 DIAGNOSIS — M25871 Other specified joint disorders, right ankle and foot: Secondary | ICD-10-CM | POA: Diagnosis not present

## 2022-09-05 DIAGNOSIS — F332 Major depressive disorder, recurrent severe without psychotic features: Secondary | ICD-10-CM | POA: Diagnosis not present

## 2022-09-05 DIAGNOSIS — F411 Generalized anxiety disorder: Secondary | ICD-10-CM | POA: Diagnosis not present

## 2022-09-17 DIAGNOSIS — F332 Major depressive disorder, recurrent severe without psychotic features: Secondary | ICD-10-CM | POA: Diagnosis not present

## 2022-12-04 DIAGNOSIS — F332 Major depressive disorder, recurrent severe without psychotic features: Secondary | ICD-10-CM | POA: Diagnosis not present

## 2022-12-04 DIAGNOSIS — F410 Panic disorder [episodic paroxysmal anxiety] without agoraphobia: Secondary | ICD-10-CM | POA: Diagnosis not present

## 2022-12-18 DIAGNOSIS — E291 Testicular hypofunction: Secondary | ICD-10-CM | POA: Diagnosis not present

## 2022-12-18 DIAGNOSIS — Z7251 High risk heterosexual behavior: Secondary | ICD-10-CM | POA: Diagnosis not present

## 2022-12-18 DIAGNOSIS — K219 Gastro-esophageal reflux disease without esophagitis: Secondary | ICD-10-CM | POA: Diagnosis not present

## 2022-12-18 DIAGNOSIS — H6122 Impacted cerumen, left ear: Secondary | ICD-10-CM | POA: Diagnosis not present

## 2022-12-18 DIAGNOSIS — Z23 Encounter for immunization: Secondary | ICD-10-CM | POA: Diagnosis not present

## 2022-12-18 DIAGNOSIS — E785 Hyperlipidemia, unspecified: Secondary | ICD-10-CM | POA: Diagnosis not present

## 2023-01-10 DIAGNOSIS — E291 Testicular hypofunction: Secondary | ICD-10-CM | POA: Diagnosis not present

## 2023-02-04 DIAGNOSIS — E291 Testicular hypofunction: Secondary | ICD-10-CM | POA: Diagnosis not present

## 2023-02-13 DIAGNOSIS — Z1211 Encounter for screening for malignant neoplasm of colon: Secondary | ICD-10-CM | POA: Diagnosis not present

## 2023-02-13 DIAGNOSIS — K219 Gastro-esophageal reflux disease without esophagitis: Secondary | ICD-10-CM | POA: Diagnosis not present

## 2023-02-13 DIAGNOSIS — K529 Noninfective gastroenteritis and colitis, unspecified: Secondary | ICD-10-CM | POA: Diagnosis not present

## 2023-03-18 DIAGNOSIS — K648 Other hemorrhoids: Secondary | ICD-10-CM | POA: Diagnosis not present

## 2023-03-18 DIAGNOSIS — Z1211 Encounter for screening for malignant neoplasm of colon: Secondary | ICD-10-CM | POA: Diagnosis not present

## 2023-03-18 DIAGNOSIS — R197 Diarrhea, unspecified: Secondary | ICD-10-CM | POA: Diagnosis not present

## 2023-03-18 DIAGNOSIS — D123 Benign neoplasm of transverse colon: Secondary | ICD-10-CM | POA: Diagnosis not present

## 2023-05-28 DIAGNOSIS — F410 Panic disorder [episodic paroxysmal anxiety] without agoraphobia: Secondary | ICD-10-CM | POA: Diagnosis not present

## 2023-05-28 DIAGNOSIS — F3341 Major depressive disorder, recurrent, in partial remission: Secondary | ICD-10-CM | POA: Diagnosis not present

## 2023-05-28 DIAGNOSIS — F900 Attention-deficit hyperactivity disorder, predominantly inattentive type: Secondary | ICD-10-CM | POA: Diagnosis not present

## 2023-11-20 DIAGNOSIS — E349 Endocrine disorder, unspecified: Secondary | ICD-10-CM | POA: Diagnosis not present

## 2023-11-20 DIAGNOSIS — G4733 Obstructive sleep apnea (adult) (pediatric): Secondary | ICD-10-CM | POA: Diagnosis not present

## 2023-11-20 DIAGNOSIS — Z125 Encounter for screening for malignant neoplasm of prostate: Secondary | ICD-10-CM | POA: Diagnosis not present

## 2023-11-20 DIAGNOSIS — K219 Gastro-esophageal reflux disease without esophagitis: Secondary | ICD-10-CM | POA: Diagnosis not present

## 2023-11-20 DIAGNOSIS — Z860101 Personal history of adenomatous and serrated colon polyps: Secondary | ICD-10-CM | POA: Diagnosis not present

## 2023-11-20 DIAGNOSIS — G43909 Migraine, unspecified, not intractable, without status migrainosus: Secondary | ICD-10-CM | POA: Diagnosis not present

## 2023-11-20 DIAGNOSIS — Z0000 Encounter for general adult medical examination without abnormal findings: Secondary | ICD-10-CM | POA: Diagnosis not present

## 2023-11-20 DIAGNOSIS — Z13220 Encounter for screening for lipoid disorders: Secondary | ICD-10-CM | POA: Diagnosis not present

## 2023-11-25 DIAGNOSIS — F3341 Major depressive disorder, recurrent, in partial remission: Secondary | ICD-10-CM | POA: Diagnosis not present

## 2023-11-25 DIAGNOSIS — F410 Panic disorder [episodic paroxysmal anxiety] without agoraphobia: Secondary | ICD-10-CM | POA: Diagnosis not present

## 2023-11-25 DIAGNOSIS — F900 Attention-deficit hyperactivity disorder, predominantly inattentive type: Secondary | ICD-10-CM | POA: Diagnosis not present

## 2023-11-25 DIAGNOSIS — F341 Dysthymic disorder: Secondary | ICD-10-CM | POA: Diagnosis not present

## 2023-12-02 DIAGNOSIS — Z85828 Personal history of other malignant neoplasm of skin: Secondary | ICD-10-CM | POA: Diagnosis not present

## 2023-12-02 DIAGNOSIS — Z129 Encounter for screening for malignant neoplasm, site unspecified: Secondary | ICD-10-CM | POA: Diagnosis not present

## 2023-12-02 DIAGNOSIS — D235 Other benign neoplasm of skin of trunk: Secondary | ICD-10-CM | POA: Diagnosis not present

## 2023-12-02 DIAGNOSIS — D485 Neoplasm of uncertain behavior of skin: Secondary | ICD-10-CM | POA: Diagnosis not present

## 2023-12-02 DIAGNOSIS — D225 Melanocytic nevi of trunk: Secondary | ICD-10-CM | POA: Diagnosis not present

## 2023-12-02 DIAGNOSIS — L814 Other melanin hyperpigmentation: Secondary | ICD-10-CM | POA: Diagnosis not present

## 2024-04-24 DIAGNOSIS — F410 Panic disorder [episodic paroxysmal anxiety] without agoraphobia: Secondary | ICD-10-CM | POA: Diagnosis not present

## 2024-04-24 DIAGNOSIS — F900 Attention-deficit hyperactivity disorder, predominantly inattentive type: Secondary | ICD-10-CM | POA: Diagnosis not present

## 2024-04-24 DIAGNOSIS — F331 Major depressive disorder, recurrent, moderate: Secondary | ICD-10-CM | POA: Diagnosis not present

## 2024-04-24 DIAGNOSIS — F341 Dysthymic disorder: Secondary | ICD-10-CM | POA: Diagnosis not present

## 2024-05-20 DIAGNOSIS — G43909 Migraine, unspecified, not intractable, without status migrainosus: Secondary | ICD-10-CM | POA: Diagnosis not present

## 2024-05-20 DIAGNOSIS — E349 Endocrine disorder, unspecified: Secondary | ICD-10-CM | POA: Diagnosis not present

## 2024-05-20 DIAGNOSIS — Z79899 Other long term (current) drug therapy: Secondary | ICD-10-CM | POA: Diagnosis not present

## 2024-05-20 DIAGNOSIS — K219 Gastro-esophageal reflux disease without esophagitis: Secondary | ICD-10-CM | POA: Diagnosis not present

## 2024-07-22 DIAGNOSIS — F900 Attention-deficit hyperactivity disorder, predominantly inattentive type: Secondary | ICD-10-CM | POA: Diagnosis not present

## 2024-07-22 DIAGNOSIS — F410 Panic disorder [episodic paroxysmal anxiety] without agoraphobia: Secondary | ICD-10-CM | POA: Diagnosis not present

## 2024-07-22 DIAGNOSIS — F3342 Major depressive disorder, recurrent, in full remission: Secondary | ICD-10-CM | POA: Diagnosis not present
